# Patient Record
Sex: Female | Born: 1955
Health system: Southern US, Community
[De-identification: ages and names within clinical notes are randomized; demographics above are authoritative.]

## PROBLEM LIST (undated history)

## (undated) DIAGNOSIS — K219 Gastro-esophageal reflux disease without esophagitis: Secondary | ICD-10-CM

## (undated) DIAGNOSIS — C50919 Malignant neoplasm of unspecified site of unspecified female breast: Secondary | ICD-10-CM

## (undated) DIAGNOSIS — Z8719 Personal history of other diseases of the digestive system: Secondary | ICD-10-CM

## (undated) HISTORY — DX: Malignant neoplasm of unspecified site of unspecified female breast: C50.919

## (undated) HISTORY — PX: BREAST BIOPSY: SHX20

## (undated) HISTORY — PX: BREAST SURGERY: SHX581

---

## 1981-10-09 HISTORY — PX: TUBAL LIGATION: SHX77

## 2000-04-25 ENCOUNTER — Encounter: Payer: Self-pay | Admitting: Family Medicine

## 2000-04-25 ENCOUNTER — Encounter: Admission: RE | Admit: 2000-04-25 | Discharge: 2000-04-25 | Payer: Self-pay | Admitting: Family Medicine

## 2003-02-02 ENCOUNTER — Encounter: Admission: RE | Admit: 2003-02-02 | Discharge: 2003-02-02 | Payer: Self-pay | Admitting: Family Medicine

## 2003-02-02 ENCOUNTER — Encounter: Payer: Self-pay | Admitting: Family Medicine

## 2003-12-30 ENCOUNTER — Other Ambulatory Visit: Admission: RE | Admit: 2003-12-30 | Discharge: 2003-12-30 | Payer: Self-pay | Admitting: Family Medicine

## 2004-02-03 ENCOUNTER — Encounter: Admission: RE | Admit: 2004-02-03 | Discharge: 2004-02-03 | Payer: Self-pay | Admitting: Family Medicine

## 2004-10-09 HISTORY — PX: BREAST LUMPECTOMY: SHX2

## 2004-12-07 ENCOUNTER — Encounter (INDEPENDENT_AMBULATORY_CARE_PROVIDER_SITE_OTHER): Payer: Self-pay | Admitting: Specialist

## 2004-12-07 ENCOUNTER — Encounter: Admission: RE | Admit: 2004-12-07 | Discharge: 2004-12-07 | Payer: Self-pay | Admitting: Family Medicine

## 2004-12-07 ENCOUNTER — Encounter (INDEPENDENT_AMBULATORY_CARE_PROVIDER_SITE_OTHER): Payer: Self-pay | Admitting: Radiology

## 2004-12-09 ENCOUNTER — Ambulatory Visit: Payer: Self-pay | Admitting: Oncology

## 2004-12-14 ENCOUNTER — Encounter: Admission: RE | Admit: 2004-12-14 | Discharge: 2004-12-14 | Payer: Self-pay | Admitting: General Surgery

## 2004-12-15 ENCOUNTER — Ambulatory Visit: Admission: RE | Admit: 2004-12-15 | Discharge: 2004-12-15 | Payer: Self-pay | Admitting: Oncology

## 2004-12-16 ENCOUNTER — Ambulatory Visit: Payer: Self-pay | Admitting: Internal Medicine

## 2004-12-19 ENCOUNTER — Ambulatory Visit (HOSPITAL_BASED_OUTPATIENT_CLINIC_OR_DEPARTMENT_OTHER): Admission: RE | Admit: 2004-12-19 | Discharge: 2004-12-19 | Payer: Self-pay | Admitting: General Surgery

## 2004-12-19 ENCOUNTER — Ambulatory Visit (HOSPITAL_COMMUNITY): Admission: RE | Admit: 2004-12-19 | Discharge: 2004-12-19 | Payer: Self-pay | Admitting: General Surgery

## 2005-01-11 ENCOUNTER — Encounter: Admission: RE | Admit: 2005-01-11 | Discharge: 2005-01-11 | Payer: Self-pay | Admitting: General Surgery

## 2005-01-24 ENCOUNTER — Ambulatory Visit: Payer: Self-pay | Admitting: Oncology

## 2005-03-21 ENCOUNTER — Ambulatory Visit: Payer: Self-pay | Admitting: Oncology

## 2005-05-01 ENCOUNTER — Encounter: Admission: RE | Admit: 2005-05-01 | Discharge: 2005-05-01 | Payer: Self-pay | Admitting: General Surgery

## 2005-05-11 ENCOUNTER — Encounter (INDEPENDENT_AMBULATORY_CARE_PROVIDER_SITE_OTHER): Payer: Self-pay | Admitting: Specialist

## 2005-05-11 ENCOUNTER — Encounter: Admission: RE | Admit: 2005-05-11 | Discharge: 2005-05-11 | Payer: Self-pay | Admitting: General Surgery

## 2005-05-11 ENCOUNTER — Ambulatory Visit (HOSPITAL_COMMUNITY): Admission: RE | Admit: 2005-05-11 | Discharge: 2005-05-11 | Payer: Self-pay | Admitting: General Surgery

## 2005-05-11 ENCOUNTER — Ambulatory Visit (HOSPITAL_BASED_OUTPATIENT_CLINIC_OR_DEPARTMENT_OTHER): Admission: RE | Admit: 2005-05-11 | Discharge: 2005-05-11 | Payer: Self-pay | Admitting: General Surgery

## 2005-05-11 ENCOUNTER — Encounter (INDEPENDENT_AMBULATORY_CARE_PROVIDER_SITE_OTHER): Payer: Self-pay | Admitting: General Surgery

## 2005-05-31 ENCOUNTER — Ambulatory Visit: Payer: Self-pay | Admitting: Oncology

## 2005-06-02 ENCOUNTER — Ambulatory Visit: Admission: RE | Admit: 2005-06-02 | Discharge: 2005-08-09 | Payer: Self-pay | Admitting: Radiation Oncology

## 2005-08-18 ENCOUNTER — Ambulatory Visit: Payer: Self-pay | Admitting: Oncology

## 2005-08-24 ENCOUNTER — Ambulatory Visit (HOSPITAL_BASED_OUTPATIENT_CLINIC_OR_DEPARTMENT_OTHER): Admission: RE | Admit: 2005-08-24 | Discharge: 2005-08-24 | Payer: Self-pay | Admitting: General Surgery

## 2005-11-13 ENCOUNTER — Ambulatory Visit: Payer: Self-pay | Admitting: Oncology

## 2005-11-13 ENCOUNTER — Encounter: Admission: RE | Admit: 2005-11-13 | Discharge: 2005-11-13 | Payer: Self-pay | Admitting: Oncology

## 2005-12-18 ENCOUNTER — Other Ambulatory Visit: Admission: RE | Admit: 2005-12-18 | Discharge: 2005-12-18 | Payer: Self-pay | Admitting: Family Medicine

## 2006-03-06 ENCOUNTER — Ambulatory Visit: Payer: Self-pay | Admitting: Oncology

## 2006-03-12 LAB — CBC WITH DIFFERENTIAL/PLATELET
Basophils Absolute: 0 10*3/uL (ref 0.0–0.1)
Eosinophils Absolute: 0.1 10*3/uL (ref 0.0–0.5)
HCT: 46.4 % (ref 34.8–46.6)
HGB: 15.9 g/dL (ref 11.6–15.9)
LYMPH%: 34.5 % (ref 14.0–48.0)
MCV: 91 fL (ref 81.0–101.0)
MONO%: 9.9 % (ref 0.0–13.0)
NEUT#: 2 10*3/uL (ref 1.5–6.5)
NEUT%: 52.3 % (ref 39.6–76.8)
Platelets: 270 10*3/uL (ref 145–400)
RBC: 5.1 10*6/uL (ref 3.70–5.32)

## 2006-03-12 LAB — COMPREHENSIVE METABOLIC PANEL
Alkaline Phosphatase: 77 U/L (ref 39–117)
BUN: 11 mg/dL (ref 6–23)
Creatinine, Ser: 0.91 mg/dL (ref 0.40–1.20)
Glucose, Bld: 78 mg/dL (ref 70–99)
Total Bilirubin: 0.7 mg/dL (ref 0.3–1.2)

## 2006-03-12 LAB — LACTATE DEHYDROGENASE: LDH: 138 U/L (ref 94–250)

## 2006-07-05 ENCOUNTER — Ambulatory Visit: Payer: Self-pay | Admitting: Oncology

## 2006-10-09 HISTORY — PX: MASTECTOMY: SHX3

## 2006-10-09 HISTORY — PX: RECONSTRUCTION BREAST W/ TRAM FLAP: SUR1079

## 2006-11-14 ENCOUNTER — Encounter: Admission: RE | Admit: 2006-11-14 | Discharge: 2006-11-14 | Payer: Self-pay | Admitting: Oncology

## 2006-11-14 ENCOUNTER — Ambulatory Visit (HOSPITAL_COMMUNITY): Admission: RE | Admit: 2006-11-14 | Discharge: 2006-11-14 | Payer: Self-pay | Admitting: Oncology

## 2006-11-28 ENCOUNTER — Encounter (INDEPENDENT_AMBULATORY_CARE_PROVIDER_SITE_OTHER): Payer: Self-pay | Admitting: Diagnostic Radiology

## 2006-11-28 ENCOUNTER — Encounter: Admission: RE | Admit: 2006-11-28 | Discharge: 2006-11-28 | Payer: Self-pay | Admitting: Oncology

## 2006-11-28 ENCOUNTER — Encounter (INDEPENDENT_AMBULATORY_CARE_PROVIDER_SITE_OTHER): Payer: Self-pay | Admitting: Specialist

## 2006-11-30 ENCOUNTER — Ambulatory Visit: Payer: Self-pay | Admitting: Oncology

## 2006-12-03 LAB — CBC WITH DIFFERENTIAL/PLATELET
BASO%: 0.8 % (ref 0.0–2.0)
EOS%: 1.6 % (ref 0.0–7.0)
LYMPH%: 37.1 % (ref 14.0–48.0)
MCHC: 34.9 g/dL (ref 32.0–36.0)
MCV: 88.7 fL (ref 81.0–101.0)
MONO%: 8.6 % (ref 0.0–13.0)
NEUT#: 2.5 10*3/uL (ref 1.5–6.5)
Platelets: 292 10*3/uL (ref 145–400)
RBC: 5.07 10*6/uL (ref 3.70–5.32)
RDW: 12.9 % (ref 11.3–14.5)
WBC: 4.8 10*3/uL (ref 3.9–10.0)

## 2006-12-03 LAB — LACTATE DEHYDROGENASE: LDH: 144 U/L (ref 94–250)

## 2006-12-03 LAB — COMPREHENSIVE METABOLIC PANEL
BUN: 12 mg/dL (ref 6–23)
CO2: 27 mEq/L (ref 19–32)
Calcium: 10.3 mg/dL (ref 8.4–10.5)
Chloride: 105 mEq/L (ref 96–112)
Creatinine, Ser: 0.82 mg/dL (ref 0.40–1.20)
Glucose, Bld: 88 mg/dL (ref 70–99)

## 2006-12-12 ENCOUNTER — Ambulatory Visit (HOSPITAL_COMMUNITY): Admission: RE | Admit: 2006-12-12 | Discharge: 2006-12-12 | Payer: Self-pay | Admitting: Oncology

## 2006-12-17 LAB — CBC WITH DIFFERENTIAL/PLATELET
BASO%: 0.7 % (ref 0.0–2.0)
Eosinophils Absolute: 0.1 10*3/uL (ref 0.0–0.5)
HCT: 47.2 % — ABNORMAL HIGH (ref 34.8–46.6)
LYMPH%: 36 % (ref 14.0–48.0)
MCHC: 34.9 g/dL (ref 32.0–36.0)
MCV: 88.3 fL (ref 81.0–101.0)
MONO#: 0.4 10*3/uL (ref 0.1–0.9)
MONO%: 8.6 % (ref 0.0–13.0)
NEUT%: 53.1 % (ref 39.6–76.8)
Platelets: 300 10*3/uL (ref 145–400)
WBC: 4.9 10*3/uL (ref 3.9–10.0)

## 2006-12-17 LAB — COMPREHENSIVE METABOLIC PANEL
Alkaline Phosphatase: 67 U/L (ref 39–117)
CO2: 25 mEq/L (ref 19–32)
Creatinine, Ser: 0.92 mg/dL (ref 0.40–1.20)
Glucose, Bld: 94 mg/dL (ref 70–99)
Total Bilirubin: 0.9 mg/dL (ref 0.3–1.2)

## 2006-12-17 LAB — LACTATE DEHYDROGENASE: LDH: 157 U/L (ref 94–250)

## 2006-12-27 ENCOUNTER — Encounter (INDEPENDENT_AMBULATORY_CARE_PROVIDER_SITE_OTHER): Payer: Self-pay | Admitting: *Deleted

## 2006-12-27 ENCOUNTER — Observation Stay (HOSPITAL_COMMUNITY): Admission: RE | Admit: 2006-12-27 | Discharge: 2006-12-28 | Payer: Self-pay | Admitting: General Surgery

## 2007-01-09 LAB — CBC WITH DIFFERENTIAL/PLATELET
EOS%: 2.7 % (ref 0.0–7.0)
HGB: 14.3 g/dL (ref 11.6–15.9)
LYMPH%: 27.7 % (ref 14.0–48.0)
MCH: 31.2 pg (ref 26.0–34.0)
MCHC: 35 g/dL (ref 32.0–36.0)
MCV: 89.2 fL (ref 81.0–101.0)
MONO%: 6.6 % (ref 0.0–13.0)
NEUT#: 4.1 10*3/uL (ref 1.5–6.5)
Platelets: 353 10*3/uL (ref 145–400)
RBC: 4.59 10*6/uL (ref 3.70–5.32)
RDW: 12.6 % (ref 11.3–14.5)
WBC: 6.6 10*3/uL (ref 3.9–10.0)

## 2007-01-16 ENCOUNTER — Ambulatory Visit (HOSPITAL_BASED_OUTPATIENT_CLINIC_OR_DEPARTMENT_OTHER): Admission: RE | Admit: 2007-01-16 | Discharge: 2007-01-16 | Payer: Self-pay | Admitting: General Surgery

## 2007-01-21 ENCOUNTER — Ambulatory Visit: Payer: Self-pay | Admitting: Oncology

## 2007-01-31 LAB — CBC WITH DIFFERENTIAL/PLATELET
BASO%: 0.9 % (ref 0.0–2.0)
Eosinophils Absolute: 0.1 10*3/uL (ref 0.0–0.5)
LYMPH%: 40.2 % (ref 14.0–48.0)
MCHC: 35.1 g/dL (ref 32.0–36.0)
MONO#: 0.6 10*3/uL (ref 0.1–0.9)
MONO%: 11.4 % (ref 0.0–13.0)
NEUT#: 2.2 10*3/uL (ref 1.5–6.5)
RBC: 4.57 10*6/uL (ref 3.70–5.32)
RDW: 12.5 % (ref 11.3–14.5)
WBC: 4.9 10*3/uL (ref 3.9–10.0)

## 2007-02-07 LAB — CBC WITH DIFFERENTIAL/PLATELET
BASO%: 0.9 % (ref 0.0–2.0)
HCT: 39.3 % (ref 34.8–46.6)
MCHC: 34.9 g/dL (ref 32.0–36.0)
MONO#: 0.2 10*3/uL (ref 0.1–0.9)
NEUT%: 55.4 % (ref 39.6–76.8)
RBC: 4.4 10*6/uL (ref 3.70–5.32)
WBC: 3.9 10*3/uL (ref 3.9–10.0)
lymph#: 1.4 10*3/uL (ref 0.9–3.3)

## 2007-02-14 LAB — CBC WITH DIFFERENTIAL/PLATELET
Basophils Absolute: 0.1 10*3/uL (ref 0.0–0.1)
Eosinophils Absolute: 0.1 10*3/uL (ref 0.0–0.5)
HCT: 38 % (ref 34.8–46.6)
HGB: 13.7 g/dL (ref 11.6–15.9)
MONO#: 0.2 10*3/uL (ref 0.1–0.9)
NEUT%: 38.8 % — ABNORMAL LOW (ref 39.6–76.8)
WBC: 3 10*3/uL — ABNORMAL LOW (ref 3.9–10.0)
lymph#: 1.5 10*3/uL (ref 0.9–3.3)

## 2007-02-21 LAB — CBC WITH DIFFERENTIAL/PLATELET
Basophils Absolute: 0.1 10*3/uL (ref 0.0–0.1)
EOS%: 1.1 % (ref 0.0–7.0)
HCT: 37.5 % (ref 34.8–46.6)
HGB: 13.1 g/dL (ref 11.6–15.9)
MCH: 30.6 pg (ref 26.0–34.0)
MCV: 87.2 fL (ref 81.0–101.0)
MONO%: 8.3 % (ref 0.0–13.0)
NEUT%: 35.1 % — ABNORMAL LOW (ref 39.6–76.8)

## 2007-02-21 LAB — COMPREHENSIVE METABOLIC PANEL
AST: 42 U/L — ABNORMAL HIGH (ref 0–37)
BUN: 9 mg/dL (ref 6–23)
Calcium: 9.3 mg/dL (ref 8.4–10.5)
Chloride: 107 mEq/L (ref 96–112)
Creatinine, Ser: 0.77 mg/dL (ref 0.40–1.20)
Glucose, Bld: 105 mg/dL — ABNORMAL HIGH (ref 70–99)

## 2007-02-28 LAB — CBC WITH DIFFERENTIAL/PLATELET
Basophils Absolute: 0.1 10*3/uL (ref 0.0–0.1)
EOS%: 1.2 % (ref 0.0–7.0)
Eosinophils Absolute: 0 10*3/uL (ref 0.0–0.5)
HCT: 38.9 % (ref 34.8–46.6)
HGB: 13.3 g/dL (ref 11.6–15.9)
MCH: 30.2 pg (ref 26.0–34.0)
MONO#: 0.4 10*3/uL (ref 0.1–0.9)
NEUT#: 1.3 10*3/uL — ABNORMAL LOW (ref 1.5–6.5)
NEUT%: 39.2 % — ABNORMAL LOW (ref 39.6–76.8)
lymph#: 1.4 10*3/uL (ref 0.9–3.3)

## 2007-03-07 ENCOUNTER — Ambulatory Visit: Payer: Self-pay | Admitting: Oncology

## 2007-03-07 LAB — CBC WITH DIFFERENTIAL/PLATELET
BASO%: 1.1 % (ref 0.0–2.0)
EOS%: 1.7 % (ref 0.0–7.0)
Eosinophils Absolute: 0.1 10*3/uL (ref 0.0–0.5)
HCT: 37.4 % (ref 34.8–46.6)
LYMPH%: 36.4 % (ref 14.0–48.0)
MCHC: 35 g/dL (ref 32.0–36.0)
MCV: 87.7 fL (ref 81.0–101.0)
MONO#: 0.3 10*3/uL (ref 0.1–0.9)
MONO%: 8.3 % (ref 0.0–13.0)
NEUT%: 52.5 % (ref 39.6–76.8)
Platelets: 180 10*3/uL (ref 145–400)
WBC: 3.8 10*3/uL — ABNORMAL LOW (ref 3.9–10.0)

## 2007-03-14 LAB — CBC WITH DIFFERENTIAL/PLATELET
BASO%: 1.6 % (ref 0.0–2.0)
EOS%: 1.5 % (ref 0.0–7.0)
HCT: 32.8 % — ABNORMAL LOW (ref 34.8–46.6)
LYMPH%: 60.9 % — ABNORMAL HIGH (ref 14.0–48.0)
MCH: 30.3 pg (ref 26.0–34.0)
MCHC: 34.9 g/dL (ref 32.0–36.0)
NEUT%: 26.6 % — ABNORMAL LOW (ref 39.6–76.8)
Platelets: 248 10*3/uL (ref 145–400)
RBC: 3.78 10*6/uL (ref 3.70–5.32)

## 2007-03-21 LAB — CBC WITH DIFFERENTIAL/PLATELET
BASO%: 2 % (ref 0.0–2.0)
EOS%: 1.1 % (ref 0.0–7.0)
MCH: 30.5 pg (ref 26.0–34.0)
MCHC: 34.1 g/dL (ref 32.0–36.0)
NEUT%: 19.4 % — ABNORMAL LOW (ref 39.6–76.8)
RBC: 4.05 10*6/uL (ref 3.70–5.32)
RDW: 14.5 % (ref 11.3–14.5)
lymph#: 2 10*3/uL (ref 0.9–3.3)

## 2007-03-21 LAB — COMPREHENSIVE METABOLIC PANEL
ALT: 72 U/L — ABNORMAL HIGH (ref 0–35)
AST: 42 U/L — ABNORMAL HIGH (ref 0–37)
Calcium: 9 mg/dL (ref 8.4–10.5)
Chloride: 109 mEq/L (ref 96–112)
Creatinine, Ser: 0.83 mg/dL (ref 0.40–1.20)
Potassium: 4 mEq/L (ref 3.5–5.3)
Sodium: 145 mEq/L (ref 135–145)

## 2007-03-28 LAB — CBC WITH DIFFERENTIAL/PLATELET
BASO%: 1.5 % (ref 0.0–2.0)
EOS%: 1.4 % (ref 0.0–7.0)
HCT: 36.9 % (ref 34.8–46.6)
LYMPH%: 49.6 % — ABNORMAL HIGH (ref 14.0–48.0)
MCH: 30.3 pg (ref 26.0–34.0)
MCHC: 34.1 g/dL (ref 32.0–36.0)
NEUT%: 30.1 % — ABNORMAL LOW (ref 39.6–76.8)
lymph#: 1.7 10*3/uL (ref 0.9–3.3)

## 2007-03-28 LAB — COMPREHENSIVE METABOLIC PANEL
ALT: 60 U/L — ABNORMAL HIGH (ref 0–35)
AST: 44 U/L — ABNORMAL HIGH (ref 0–37)
Alkaline Phosphatase: 80 U/L (ref 39–117)
Chloride: 107 mEq/L (ref 96–112)
Creatinine, Ser: 0.84 mg/dL (ref 0.40–1.20)
Total Bilirubin: 0.6 mg/dL (ref 0.3–1.2)

## 2007-04-04 LAB — CBC WITH DIFFERENTIAL/PLATELET
BASO%: 1.6 % (ref 0.0–2.0)
Basophils Absolute: 0 10*3/uL (ref 0.0–0.1)
EOS%: 2.2 % (ref 0.0–7.0)
HCT: 36 % (ref 34.8–46.6)
HGB: 12.4 g/dL (ref 11.6–15.9)
LYMPH%: 43 % (ref 14.0–48.0)
MCH: 30 pg (ref 26.0–34.0)
MCHC: 34.6 g/dL (ref 32.0–36.0)
MCV: 86.9 fL (ref 81.0–101.0)
NEUT%: 45.8 % (ref 39.6–76.8)
Platelets: 234 10*3/uL (ref 145–400)

## 2007-04-04 LAB — COMPREHENSIVE METABOLIC PANEL
ALT: 71 U/L — ABNORMAL HIGH (ref 0–35)
AST: 50 U/L — ABNORMAL HIGH (ref 0–37)
BUN: 10 mg/dL (ref 6–23)
CO2: 24 mEq/L (ref 19–32)
Calcium: 9.7 mg/dL (ref 8.4–10.5)
Creatinine, Ser: 0.81 mg/dL (ref 0.40–1.20)
Total Bilirubin: 0.8 mg/dL (ref 0.3–1.2)

## 2007-04-10 LAB — COMPREHENSIVE METABOLIC PANEL
BUN: 10 mg/dL (ref 6–23)
CO2: 24 mEq/L (ref 19–32)
Calcium: 9.1 mg/dL (ref 8.4–10.5)
Chloride: 111 mEq/L (ref 96–112)
Creatinine, Ser: 0.77 mg/dL (ref 0.40–1.20)
Glucose, Bld: 122 mg/dL — ABNORMAL HIGH (ref 70–99)
Total Bilirubin: 0.5 mg/dL (ref 0.3–1.2)

## 2007-04-10 LAB — CBC WITH DIFFERENTIAL/PLATELET
BASO%: 1.4 % (ref 0.0–2.0)
Basophils Absolute: 0 10*3/uL (ref 0.0–0.1)
HCT: 34.7 % — ABNORMAL LOW (ref 34.8–46.6)
HGB: 12.2 g/dL (ref 11.6–15.9)
LYMPH%: 58.5 % — ABNORMAL HIGH (ref 14.0–48.0)
MCHC: 35.2 g/dL (ref 32.0–36.0)
MONO#: 0.2 10*3/uL (ref 0.1–0.9)
NEUT%: 30.3 % — ABNORMAL LOW (ref 39.6–76.8)
Platelets: 265 10*3/uL (ref 145–400)
WBC: 3.1 10*3/uL — ABNORMAL LOW (ref 3.9–10.0)
lymph#: 1.8 10*3/uL (ref 0.9–3.3)

## 2007-04-17 LAB — CBC WITH DIFFERENTIAL/PLATELET
BASO%: 2.1 % — ABNORMAL HIGH (ref 0.0–2.0)
Basophils Absolute: 0.1 10*3/uL (ref 0.0–0.1)
Eosinophils Absolute: 0.1 10*3/uL (ref 0.0–0.5)
HCT: 36.4 % (ref 34.8–46.6)
HGB: 12.3 g/dL (ref 11.6–15.9)
LYMPH%: 49.7 % — ABNORMAL HIGH (ref 14.0–48.0)
MCHC: 33.9 g/dL (ref 32.0–36.0)
MONO#: 0.7 10*3/uL (ref 0.1–0.9)
NEUT#: 1 10*3/uL — ABNORMAL LOW (ref 1.5–6.5)
NEUT%: 27.8 % — ABNORMAL LOW (ref 39.6–76.8)
Platelets: 354 10*3/uL (ref 145–400)
WBC: 3.7 10*3/uL — ABNORMAL LOW (ref 3.9–10.0)
lymph#: 1.8 10*3/uL (ref 0.9–3.3)

## 2007-04-17 LAB — COMPREHENSIVE METABOLIC PANEL
AST: 37 U/L (ref 0–37)
Alkaline Phosphatase: 65 U/L (ref 39–117)
BUN: 11 mg/dL (ref 6–23)
Calcium: 9.3 mg/dL (ref 8.4–10.5)
Chloride: 108 mEq/L (ref 96–112)
Creatinine, Ser: 0.88 mg/dL (ref 0.40–1.20)
Glucose, Bld: 111 mg/dL — ABNORMAL HIGH (ref 70–99)

## 2007-04-19 ENCOUNTER — Ambulatory Visit: Payer: Self-pay | Admitting: Oncology

## 2007-04-23 ENCOUNTER — Ambulatory Visit: Admission: RE | Admit: 2007-04-23 | Discharge: 2007-04-23 | Payer: Self-pay | Admitting: Oncology

## 2007-04-24 LAB — CBC WITH DIFFERENTIAL/PLATELET
BASO%: 1.9 % (ref 0.0–2.0)
EOS%: 2.5 % (ref 0.0–7.0)
HCT: 36.9 % (ref 34.8–46.6)
MCH: 30.5 pg (ref 26.0–34.0)
MCHC: 34.1 g/dL (ref 32.0–36.0)
MONO#: 0.8 10*3/uL (ref 0.1–0.9)
NEUT%: 42.3 % (ref 39.6–76.8)
RBC: 4.12 10*6/uL (ref 3.70–5.32)
RDW: 13.9 % (ref 11.3–14.5)
WBC: 5.7 10*3/uL (ref 3.9–10.0)
lymph#: 2.2 10*3/uL (ref 0.9–3.3)

## 2007-04-24 LAB — COMPREHENSIVE METABOLIC PANEL
ALT: 45 U/L — ABNORMAL HIGH (ref 0–35)
Albumin: 4.2 g/dL (ref 3.5–5.2)
CO2: 24 mEq/L (ref 19–32)
Calcium: 9.7 mg/dL (ref 8.4–10.5)
Chloride: 107 mEq/L (ref 96–112)
Creatinine, Ser: 0.91 mg/dL (ref 0.40–1.20)
Potassium: 4.1 mEq/L (ref 3.5–5.3)

## 2007-05-01 LAB — CBC WITH DIFFERENTIAL/PLATELET
Basophils Absolute: 0.1 10*3/uL (ref 0.0–0.1)
Eosinophils Absolute: 0.1 10*3/uL (ref 0.0–0.5)
HCT: 35.7 % (ref 34.8–46.6)
HGB: 12.1 g/dL (ref 11.6–15.9)
MONO#: 0.6 10*3/uL (ref 0.1–0.9)
NEUT#: 1.7 10*3/uL (ref 1.5–6.5)
NEUT%: 39.4 % — ABNORMAL LOW (ref 39.6–76.8)
RDW: 14.3 % (ref 11.3–14.5)
WBC: 4.3 10*3/uL (ref 3.9–10.0)
lymph#: 1.9 10*3/uL (ref 0.9–3.3)

## 2007-05-08 LAB — CBC WITH DIFFERENTIAL/PLATELET
Basophils Absolute: 0.1 10*3/uL (ref 0.0–0.1)
EOS%: 1 % (ref 0.0–7.0)
Eosinophils Absolute: 0 10*3/uL (ref 0.0–0.5)
HCT: 34 % — ABNORMAL LOW (ref 34.8–46.6)
HGB: 11.7 g/dL (ref 11.6–15.9)
MCH: 30.5 pg (ref 26.0–34.0)
MCV: 88.5 fL (ref 81.0–101.0)
MONO%: 15.3 % — ABNORMAL HIGH (ref 0.0–13.0)
NEUT#: 1.6 10*3/uL (ref 1.5–6.5)
NEUT%: 38.7 % — ABNORMAL LOW (ref 39.6–76.8)
RDW: 14.8 % — ABNORMAL HIGH (ref 11.3–14.5)
lymph#: 1.8 10*3/uL (ref 0.9–3.3)

## 2007-05-15 LAB — CBC WITH DIFFERENTIAL/PLATELET
Basophils Absolute: 0.1 10*3/uL (ref 0.0–0.1)
EOS%: 1 % (ref 0.0–7.0)
Eosinophils Absolute: 0 10*3/uL (ref 0.0–0.5)
HGB: 12 g/dL (ref 11.6–15.9)
LYMPH%: 44.7 % (ref 14.0–48.0)
MCH: 30.2 pg (ref 26.0–34.0)
MCV: 89 fL (ref 81.0–101.0)
MONO%: 14.7 % — ABNORMAL HIGH (ref 0.0–13.0)
NEUT#: 1.4 10*3/uL — ABNORMAL LOW (ref 1.5–6.5)
Platelets: 205 10*3/uL (ref 145–400)
RDW: 15.4 % — ABNORMAL HIGH (ref 11.3–14.5)

## 2007-06-02 ENCOUNTER — Ambulatory Visit: Payer: Self-pay | Admitting: Oncology

## 2007-06-05 LAB — CBC WITH DIFFERENTIAL/PLATELET
BASO%: 1.4 % (ref 0.0–2.0)
Basophils Absolute: 0.1 10*3/uL (ref 0.0–0.1)
EOS%: 2.6 % (ref 0.0–7.0)
HGB: 12.9 g/dL (ref 11.6–15.9)
MCH: 29.4 pg (ref 26.0–34.0)
MCHC: 33.2 g/dL (ref 32.0–36.0)
MCV: 88.5 fL (ref 81.0–101.0)
MONO%: 15.2 % — ABNORMAL HIGH (ref 0.0–13.0)
RBC: 4.38 10*6/uL (ref 3.70–5.32)
RDW: 13.9 % (ref 11.3–14.5)
lymph#: 2.1 10*3/uL (ref 0.9–3.3)

## 2007-06-05 LAB — COMPREHENSIVE METABOLIC PANEL
ALT: 31 U/L (ref 0–35)
AST: 28 U/L (ref 0–37)
Albumin: 4.2 g/dL (ref 3.5–5.2)
Alkaline Phosphatase: 79 U/L (ref 39–117)
BUN: 13 mg/dL (ref 6–23)
Calcium: 9.1 mg/dL (ref 8.4–10.5)
Chloride: 108 mEq/L (ref 96–112)
Potassium: 3.7 mEq/L (ref 3.5–5.3)
Sodium: 143 mEq/L (ref 135–145)

## 2007-06-26 LAB — CBC WITH DIFFERENTIAL/PLATELET
Basophils Absolute: 0.1 10*3/uL (ref 0.0–0.1)
EOS%: 3.4 % (ref 0.0–7.0)
Eosinophils Absolute: 0.1 10*3/uL (ref 0.0–0.5)
LYMPH%: 39.2 % (ref 14.0–48.0)
MCH: 28.8 pg (ref 26.0–34.0)
MCV: 86.1 fL (ref 81.0–101.0)
MONO%: 14.5 % — ABNORMAL HIGH (ref 0.0–13.0)
NEUT#: 1.5 10*3/uL (ref 1.5–6.5)
Platelets: 253 10*3/uL (ref 145–400)
RBC: 4.42 10*6/uL (ref 3.70–5.32)

## 2007-07-17 ENCOUNTER — Ambulatory Visit: Payer: Self-pay | Admitting: Oncology

## 2007-07-17 LAB — COMPREHENSIVE METABOLIC PANEL
CO2: 24 mEq/L (ref 19–32)
Calcium: 9.8 mg/dL (ref 8.4–10.5)
Chloride: 107 mEq/L (ref 96–112)
Creatinine, Ser: 0.87 mg/dL (ref 0.40–1.20)
Glucose, Bld: 102 mg/dL — ABNORMAL HIGH (ref 70–99)
Total Bilirubin: 0.7 mg/dL (ref 0.3–1.2)

## 2007-07-17 LAB — CBC WITH DIFFERENTIAL/PLATELET
BASO%: 1.4 % (ref 0.0–2.0)
LYMPH%: 47 % (ref 14.0–48.0)
MCHC: 33.2 g/dL (ref 32.0–36.0)
MCV: 86.5 fL (ref 81.0–101.0)
MONO%: 13.9 % — ABNORMAL HIGH (ref 0.0–13.0)
Platelets: 265 10*3/uL (ref 145–400)
RBC: 4.61 10*6/uL (ref 3.70–5.32)
RDW: 12.9 % (ref 11.3–14.5)
WBC: 4 10*3/uL (ref 3.9–10.0)

## 2007-07-17 LAB — LACTATE DEHYDROGENASE: LDH: 213 U/L (ref 94–250)

## 2007-08-07 LAB — CBC WITH DIFFERENTIAL/PLATELET
BASO%: 1.6 % (ref 0.0–2.0)
Basophils Absolute: 0.1 10*3/uL (ref 0.0–0.1)
EOS%: 4 % (ref 0.0–7.0)
HCT: 40.6 % (ref 34.8–46.6)
HGB: 13.6 g/dL (ref 11.6–15.9)
MCH: 28.4 pg (ref 26.0–34.0)
MCHC: 33.5 g/dL (ref 32.0–36.0)
MONO#: 0.5 10*3/uL (ref 0.1–0.9)
NEUT%: 36.1 % — ABNORMAL LOW (ref 39.6–76.8)
RDW: 12.5 % (ref 11.3–14.5)
WBC: 3.9 10*3/uL (ref 3.9–10.0)
lymph#: 1.8 10*3/uL (ref 0.9–3.3)

## 2007-08-28 LAB — CBC WITH DIFFERENTIAL/PLATELET
Basophils Absolute: 0 10*3/uL (ref 0.0–0.1)
Eosinophils Absolute: 0.1 10*3/uL (ref 0.0–0.5)
HCT: 38.3 % (ref 34.8–46.6)
HGB: 12.8 g/dL (ref 11.6–15.9)
MCH: 28 pg (ref 26.0–34.0)
MONO#: 0.4 10*3/uL (ref 0.1–0.9)
NEUT#: 1.2 10*3/uL — ABNORMAL LOW (ref 1.5–6.5)
NEUT%: 35.1 % — ABNORMAL LOW (ref 39.6–76.8)
RDW: 12.5 % (ref 11.3–14.5)
lymph#: 1.7 10*3/uL (ref 0.9–3.3)

## 2007-08-28 LAB — COMPREHENSIVE METABOLIC PANEL
AST: 28 U/L (ref 0–37)
Albumin: 4.1 g/dL (ref 3.5–5.2)
BUN: 12 mg/dL (ref 6–23)
CO2: 25 mEq/L (ref 19–32)
Calcium: 9 mg/dL (ref 8.4–10.5)
Chloride: 106 mEq/L (ref 96–112)
Creatinine, Ser: 0.93 mg/dL (ref 0.40–1.20)
Glucose, Bld: 83 mg/dL (ref 70–99)
Potassium: 4 mEq/L (ref 3.5–5.3)

## 2007-08-28 LAB — LACTATE DEHYDROGENASE: LDH: 185 U/L (ref 94–250)

## 2007-08-28 IMAGING — CT CT CHEST W/ CM
2 of 5 series · 15 of 36 positions shown, 18 images · IV contrast (omnipaque)
Comparison: 12/15/04.
COMPARISON: 12/15/04.
COMPARISON: 12/15/04.

CLINICAL DATA: Restaging breast ca.  Right lumpectomy.  Chemotherapy and XRT completed.  
CHEST CT WITH CONTRAST:
TECHNIQUE: Multidetector CT imaging of the chest was performed following the standard protocol during bolus administration of intravenous contrast.
Contrast:  125 cc Omnipaque 300
TECHNIQUE: Multidetector CT imaging of the abdomen was performed following the standard protocol during bolus administration of intravenous contrast.
TECHNIQUE: Multidetector CT imaging of the pelvis was performed following the standard protocol during bolus administration of intravenous contrast.

[Series 2: cap 5.0 b40f st · axial · 0.69mm/px · z∈[-594,-48]mm · 12 of 125 slices shown, 15 images]
[im 8/125  mediastinal]
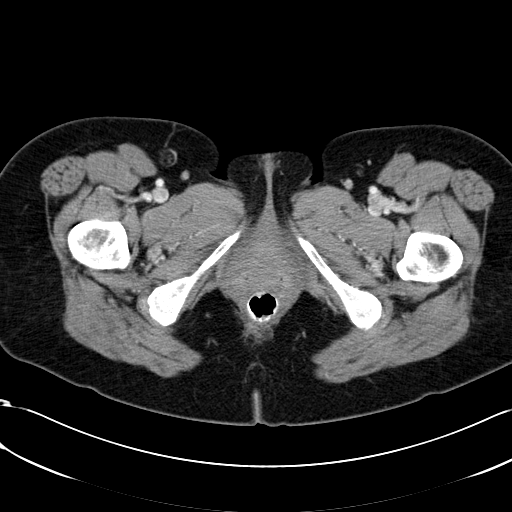
[im 8/125  lung]
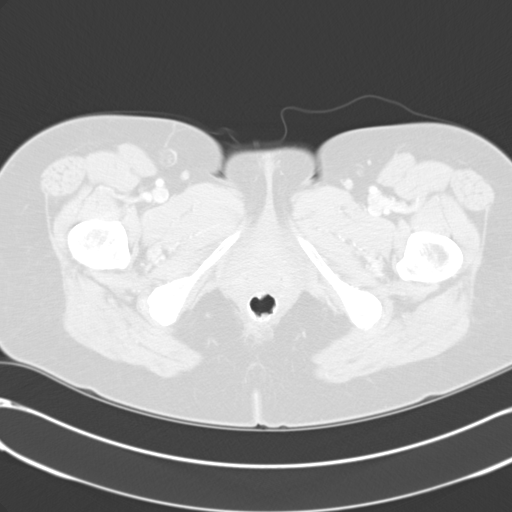
[im 16/125  lung]
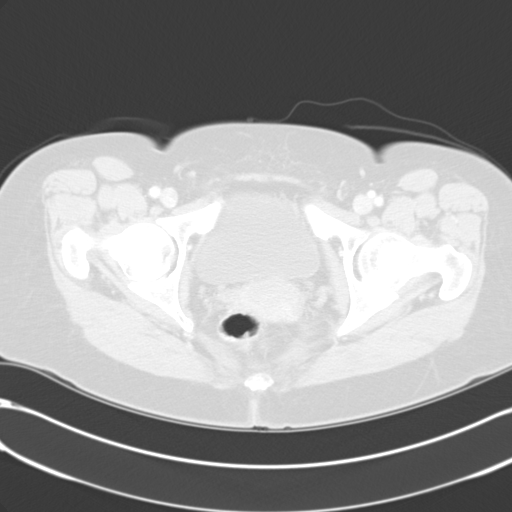
[im 32/125  lung]
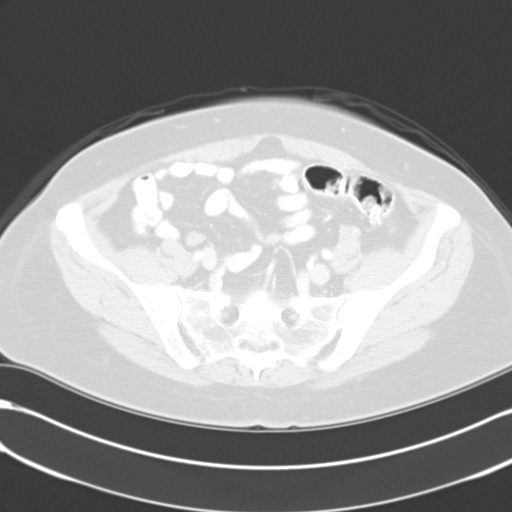
[im 39/125  lung]
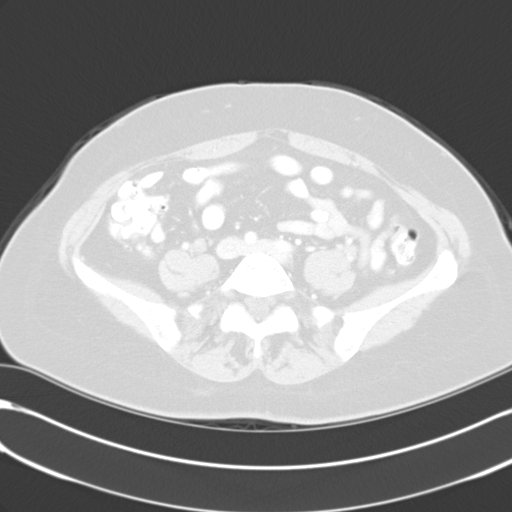
[im 47/125  mediastinal]
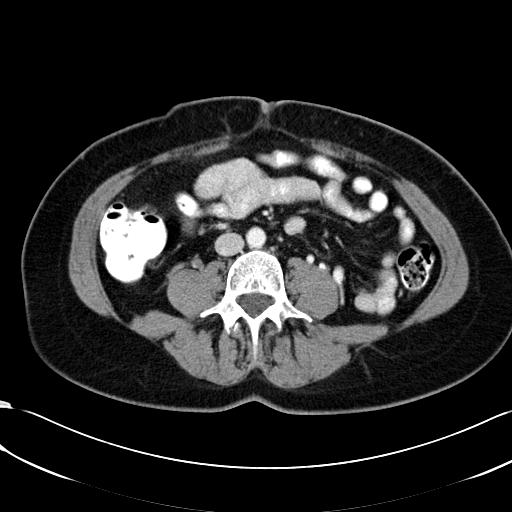
[im 47/125  lung]
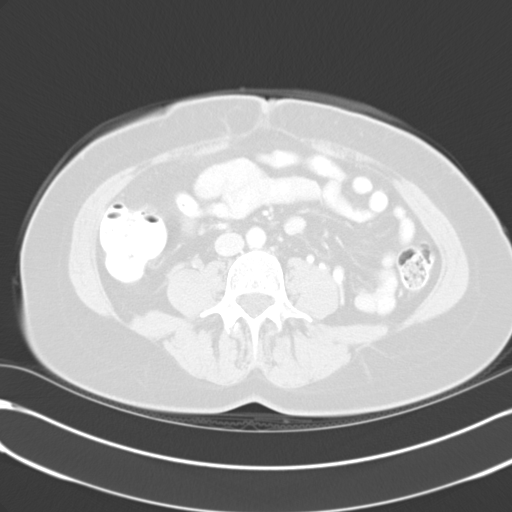
[im 55/125  lung]
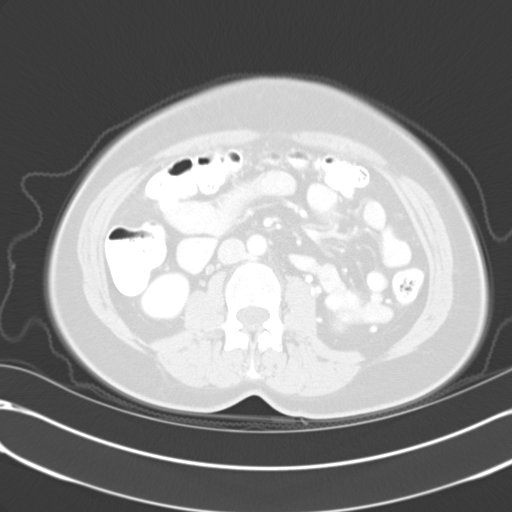
[im 70/125  lung]
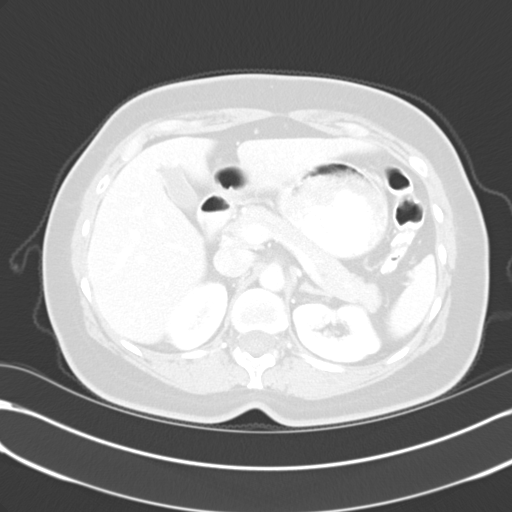
[im 78/125  lung]
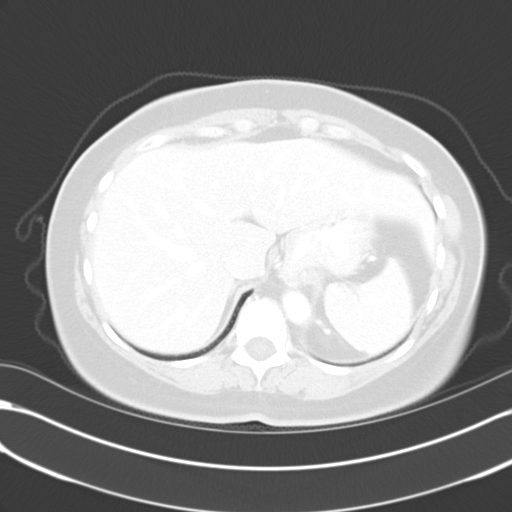
[im 86/125  mediastinal]
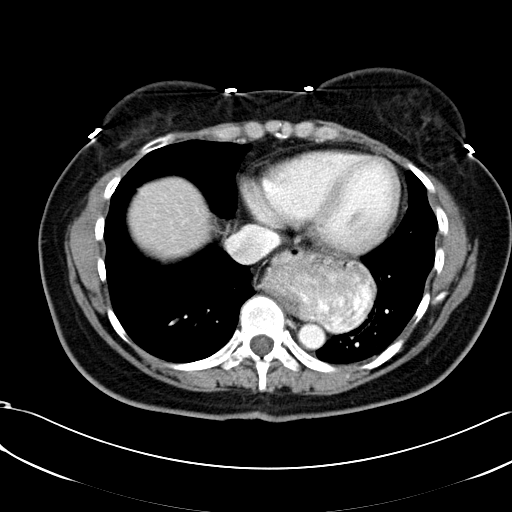
[im 86/125  lung]
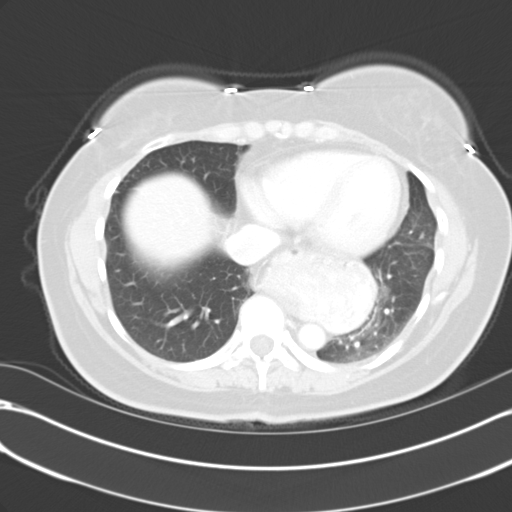
[im 94/125  lung]
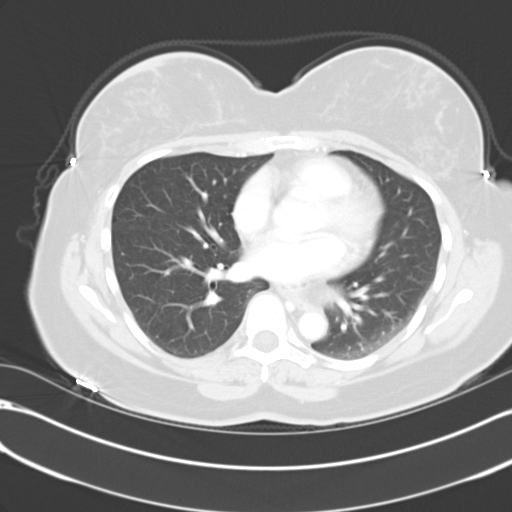
[im 109/125  lung]
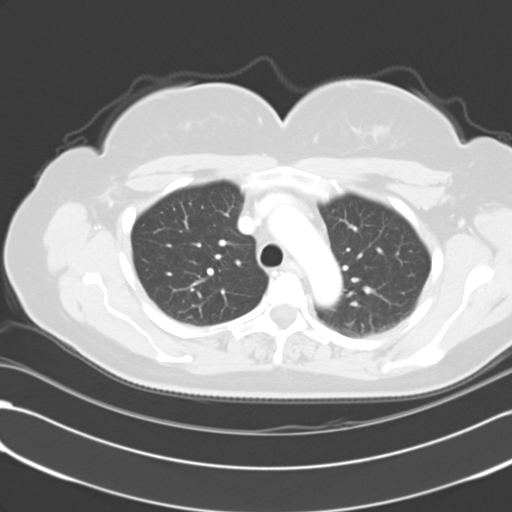
[im 117/125  lung]
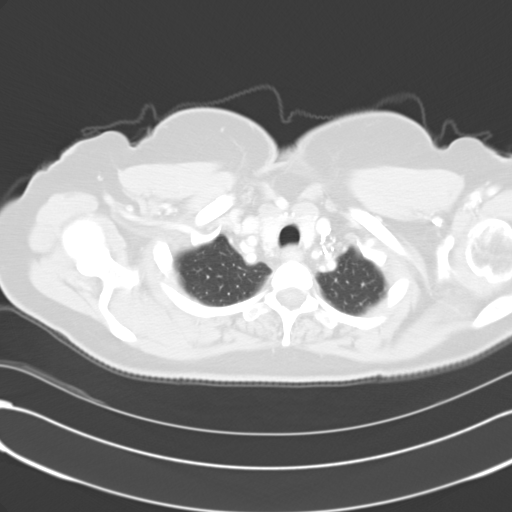

[Series 602: coronal · coronal · 1.25mm/px · 3 of 79 slices shown]
[im 16/79  lung]
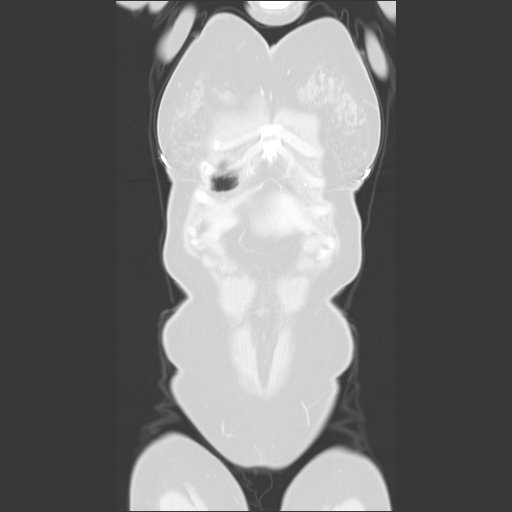
[im 32/79  lung]
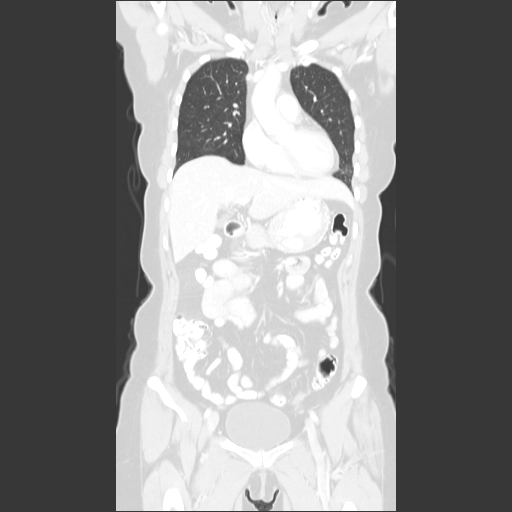
[im 47/79  lung]
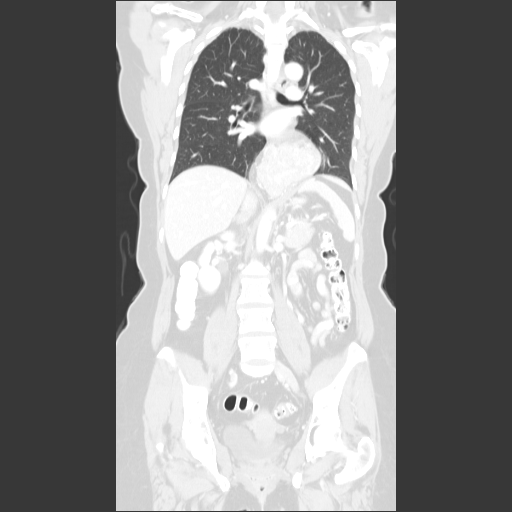

[15 of 36 positions shown; findings below may reference images not displayed]

FINDINGS: Large hiatal hernia.  No pathologically enlarged mediastinal or hilar lymph nodes.  No pulmonary nodules or masses.  Negative for metastatic involvement of the pleura or bones.  Findings compatible with post surgical scarring of the right breast.
IMPRESSION: Negative for metastatic involvement of the chest. 
ABDOMEN CT WITH CONTRAST:
FINDINGS: Large hiatal hernia.  Negative for metastatic involvement of the liver, spleen, or adrenal glands.  Pancreas and kidneys unremarkable.  No pathologically enlarged lymph nodes.  Bony structures intact.
IMPRESSION: No evidence for metastatic involvement of the abdomen. 
PELVIS CT WITH CONTRAST:
FINDINGS: Uterus and adnexal regions unremarkable.  No pelvic mass or pathologically enlarged lymph nodes.  Bony pelvis intact.
IMPRESSION: No evidence for metastatic involvement of the pelvis.   No interval change.

## 2007-09-23 ENCOUNTER — Ambulatory Visit: Payer: Self-pay | Admitting: Oncology

## 2007-09-25 LAB — COMPREHENSIVE METABOLIC PANEL
Alkaline Phosphatase: 65 U/L (ref 39–117)
CO2: 25 mEq/L (ref 19–32)
Creatinine, Ser: 0.99 mg/dL (ref 0.40–1.20)
Glucose, Bld: 95 mg/dL (ref 70–99)
Total Bilirubin: 0.6 mg/dL (ref 0.3–1.2)

## 2007-09-25 LAB — CBC WITH DIFFERENTIAL/PLATELET
BASO%: 1.2 % (ref 0.0–2.0)
Eosinophils Absolute: 0.2 10*3/uL (ref 0.0–0.5)
HCT: 40.9 % (ref 34.8–46.6)
LYMPH%: 31.7 % (ref 14.0–48.0)
MCHC: 32.6 g/dL (ref 32.0–36.0)
MCV: 83 fL (ref 81.0–101.0)
MONO#: 0.6 10*3/uL (ref 0.1–0.9)
MONO%: 10.6 % (ref 0.0–13.0)
NEUT%: 53.7 % (ref 39.6–76.8)
Platelets: 304 10*3/uL (ref 145–400)
WBC: 5.4 10*3/uL (ref 3.9–10.0)

## 2007-09-25 LAB — LACTATE DEHYDROGENASE: LDH: 197 U/L (ref 94–250)

## 2007-09-25 LAB — CANCER ANTIGEN 27.29: CA 27.29: 38 U/mL (ref 0–39)

## 2007-10-02 IMAGING — CR DG CHEST 1V PORT
1 series · 1 of 1 positions shown · non-contrast
Comparison: 12/24/06 and chest CT 12/12/06

CLINICAL DATA: Breast cancer, Port-A-Cath placement.  
 PORTABLE CHEST- 1 VIEW:

[view not recorded]
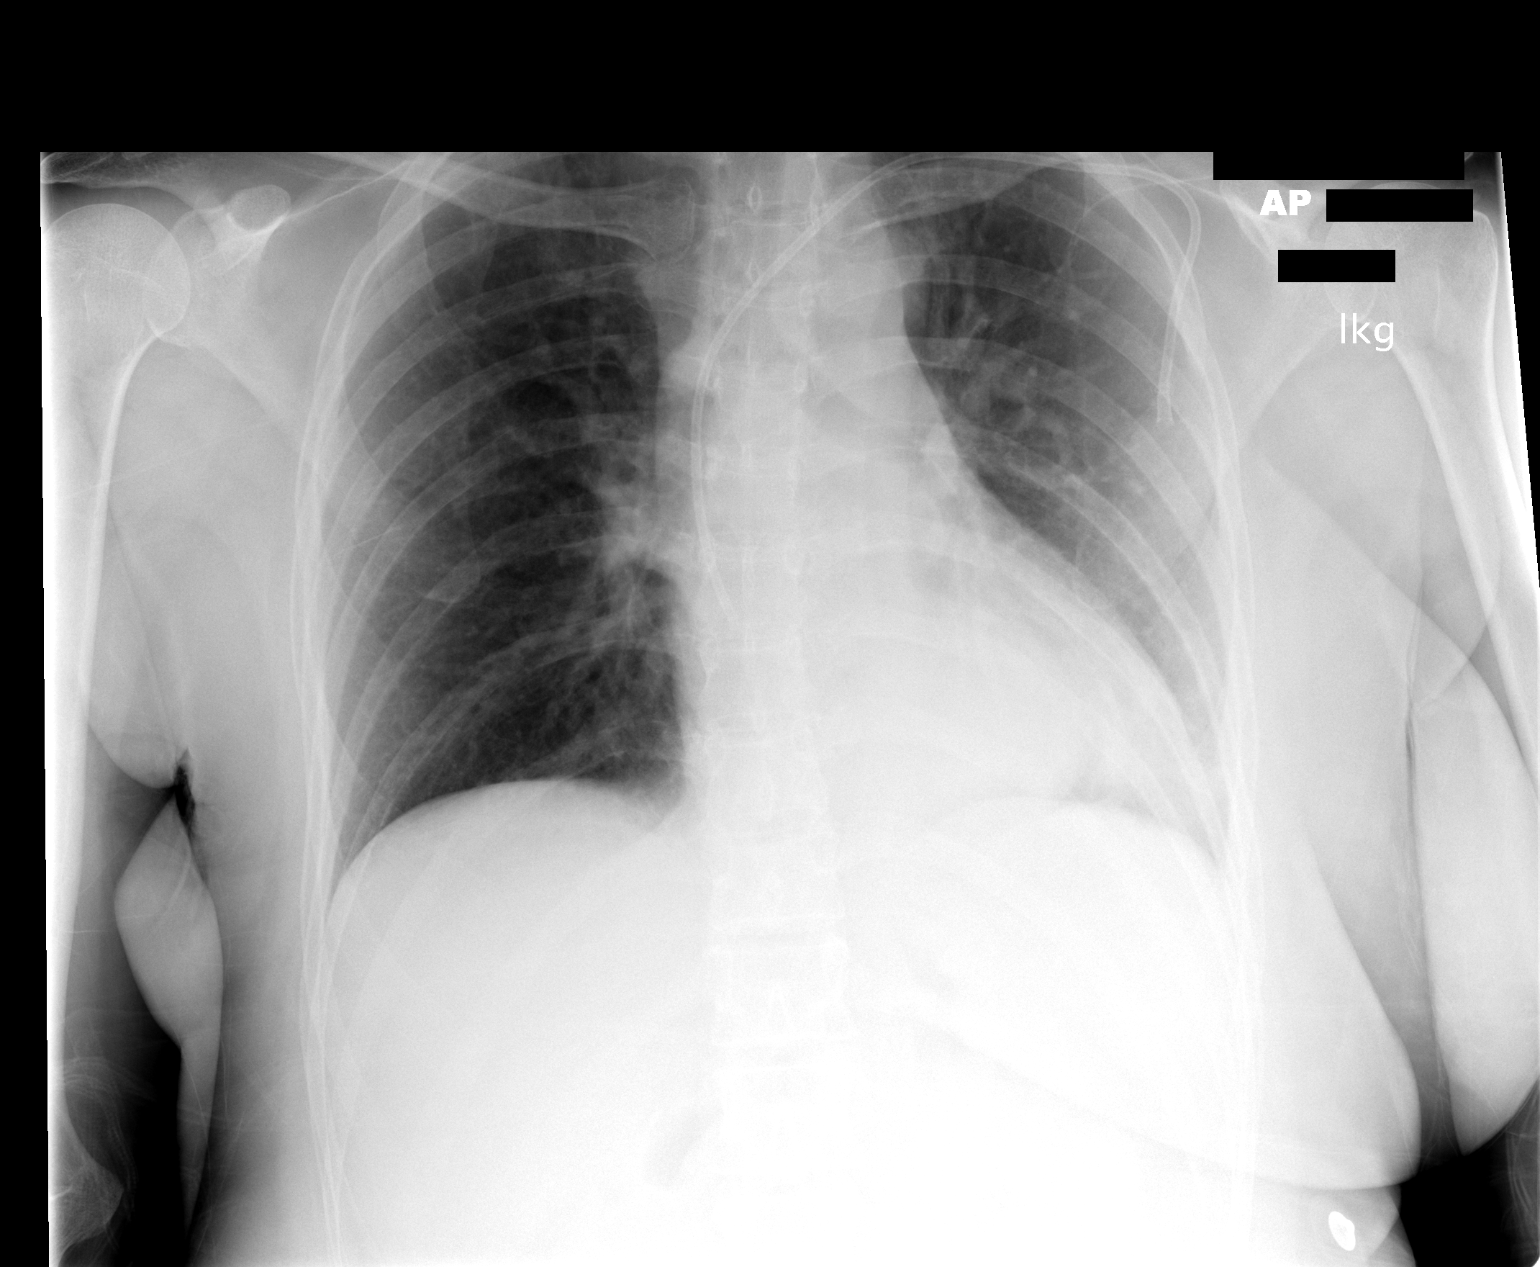

[1 of 1 positions shown; findings below may reference images not displayed]

FINDINGS: Left subclavian Port-A-Cath terminates in the distal SVC.  No pneumothorax.  Hiatal hernia with left base atelectasis.
IMPRESSION: No pneumothorax after left subclavian Port-A-Cath placement.

## 2007-10-10 HISTORY — PX: BREAST REDUCTION SURGERY: SHX8

## 2007-10-16 LAB — CBC WITH DIFFERENTIAL/PLATELET
BASO%: 1.6 % (ref 0.0–2.0)
HCT: 39.3 % (ref 34.8–46.6)
MCHC: 32.5 g/dL (ref 32.0–36.0)
MONO#: 0.5 10*3/uL (ref 0.1–0.9)
NEUT%: 36 % — ABNORMAL LOW (ref 39.6–76.8)
RBC: 4.74 10*6/uL (ref 3.70–5.32)
WBC: 4.2 10*3/uL (ref 3.9–10.0)
lymph#: 1.8 10*3/uL (ref 0.9–3.3)

## 2007-10-16 LAB — COMPREHENSIVE METABOLIC PANEL
ALT: 21 U/L (ref 0–35)
CO2: 24 mEq/L (ref 19–32)
Calcium: 9.6 mg/dL (ref 8.4–10.5)
Chloride: 108 mEq/L (ref 96–112)
Sodium: 143 mEq/L (ref 135–145)
Total Protein: 6.9 g/dL (ref 6.0–8.3)

## 2007-10-16 LAB — LACTATE DEHYDROGENASE: LDH: 169 U/L (ref 94–250)

## 2007-10-22 ENCOUNTER — Inpatient Hospital Stay (HOSPITAL_COMMUNITY): Admission: RE | Admit: 2007-10-22 | Discharge: 2007-10-24 | Payer: Self-pay | Admitting: Plastic Surgery

## 2007-11-04 ENCOUNTER — Ambulatory Visit: Payer: Self-pay | Admitting: Oncology

## 2007-11-06 LAB — CBC WITH DIFFERENTIAL/PLATELET
EOS%: 3 % (ref 0.0–7.0)
MCH: 26.5 pg (ref 26.0–34.0)
MCV: 79.8 fL — ABNORMAL LOW (ref 81.0–101.0)
MONO%: 12.6 % (ref 0.0–13.0)
RBC: 4.65 10*6/uL (ref 3.70–5.32)
RDW: 11.8 % (ref 11.3–14.5)

## 2007-11-18 ENCOUNTER — Encounter: Admission: RE | Admit: 2007-11-18 | Discharge: 2007-11-18 | Payer: Self-pay | Admitting: Family Medicine

## 2007-11-25 ENCOUNTER — Other Ambulatory Visit: Admission: RE | Admit: 2007-11-25 | Discharge: 2007-11-25 | Payer: Self-pay | Admitting: Family Medicine

## 2007-11-27 LAB — CBC WITH DIFFERENTIAL/PLATELET
BASO%: 1.8 % (ref 0.0–2.0)
EOS%: 2.7 % (ref 0.0–7.0)
HCT: 38.9 % (ref 34.8–46.6)
MCH: 26.2 pg (ref 26.0–34.0)
MCHC: 32.5 g/dL (ref 32.0–36.0)
NEUT%: 38.1 % — ABNORMAL LOW (ref 39.6–76.8)
RDW: 15.4 % — ABNORMAL HIGH (ref 11.3–14.5)
lymph#: 1.7 10*3/uL (ref 0.9–3.3)

## 2007-11-27 LAB — COMPREHENSIVE METABOLIC PANEL
ALT: 13 U/L (ref 0–35)
AST: 13 U/L (ref 0–37)
Calcium: 9.2 mg/dL (ref 8.4–10.5)
Chloride: 106 mEq/L (ref 96–112)
Creatinine, Ser: 0.91 mg/dL (ref 0.40–1.20)

## 2007-12-16 ENCOUNTER — Ambulatory Visit: Payer: Self-pay | Admitting: Oncology

## 2007-12-18 LAB — COMPREHENSIVE METABOLIC PANEL
ALT: 19 U/L (ref 0–35)
AST: 23 U/L (ref 0–37)
Calcium: 9.5 mg/dL (ref 8.4–10.5)
Chloride: 105 mEq/L (ref 96–112)
Creatinine, Ser: 0.78 mg/dL (ref 0.40–1.20)
Total Bilirubin: 0.7 mg/dL (ref 0.3–1.2)

## 2007-12-18 LAB — CBC WITH DIFFERENTIAL/PLATELET
BASO%: 1.1 % (ref 0.0–2.0)
EOS%: 4.7 % (ref 0.0–7.0)
HCT: 39.7 % (ref 34.8–46.6)
LYMPH%: 44.9 % (ref 14.0–48.0)
MCH: 26 pg (ref 26.0–34.0)
MCHC: 32.2 g/dL (ref 32.0–36.0)
MONO%: 11.2 % (ref 0.0–13.0)
NEUT%: 38.2 % — ABNORMAL LOW (ref 39.6–76.8)
Platelets: 264 10*3/uL (ref 145–400)

## 2008-01-08 LAB — CBC WITH DIFFERENTIAL/PLATELET
Basophils Absolute: 0.1 10*3/uL (ref 0.0–0.1)
Eosinophils Absolute: 0.2 10*3/uL (ref 0.0–0.5)
HCT: 37.8 % (ref 34.8–46.6)
HGB: 12.2 g/dL (ref 11.6–15.9)
MCV: 79.7 fL — ABNORMAL LOW (ref 81.0–101.0)
NEUT#: 1.8 10*3/uL (ref 1.5–6.5)
RDW: 14.9 % — ABNORMAL HIGH (ref 11.3–14.5)
lymph#: 2 10*3/uL (ref 0.9–3.3)

## 2008-01-27 ENCOUNTER — Ambulatory Visit: Payer: Self-pay | Admitting: Oncology

## 2008-01-29 LAB — CBC WITH DIFFERENTIAL/PLATELET
Basophils Absolute: 0.1 10*3/uL (ref 0.0–0.1)
Eosinophils Absolute: 0.2 10*3/uL (ref 0.0–0.5)
HGB: 12.5 g/dL (ref 11.6–15.9)
LYMPH%: 43.8 % (ref 14.0–48.0)
MCV: 78.1 fL — ABNORMAL LOW (ref 81.0–101.0)
MONO%: 12.7 % (ref 0.0–13.0)
NEUT#: 1.8 10*3/uL (ref 1.5–6.5)
NEUT%: 38.4 % — ABNORMAL LOW (ref 39.6–76.8)
Platelets: ADEQUATE 10*3/uL (ref 145–400)

## 2008-01-29 LAB — COMPREHENSIVE METABOLIC PANEL
BUN: 13 mg/dL (ref 6–23)
CO2: 24 mEq/L (ref 19–32)
Glucose, Bld: 116 mg/dL — ABNORMAL HIGH (ref 70–99)
Sodium: 140 mEq/L (ref 135–145)
Total Bilirubin: 0.7 mg/dL (ref 0.3–1.2)
Total Protein: 7.2 g/dL (ref 6.0–8.3)

## 2008-01-29 LAB — LACTATE DEHYDROGENASE: LDH: 160 U/L (ref 94–250)

## 2008-01-29 LAB — CANCER ANTIGEN 27.29: CA 27.29: 44 U/mL — ABNORMAL HIGH (ref 0–39)

## 2008-02-27 LAB — CBC WITH DIFFERENTIAL/PLATELET
BASO%: 0.5 % (ref 0.0–2.0)
HCT: 35.2 % (ref 34.8–46.6)
LYMPH%: 18.6 % (ref 14.0–48.0)
MCH: 24.6 pg — ABNORMAL LOW (ref 26.0–34.0)
MCHC: 32.2 g/dL (ref 32.0–36.0)
MCV: 76.7 fL — ABNORMAL LOW (ref 81.0–101.0)
MONO#: 0.7 10*3/uL (ref 0.1–0.9)
MONO%: 8.2 % (ref 0.0–13.0)
NEUT%: 71.3 % (ref 39.6–76.8)
Platelets: 352 10*3/uL (ref 145–400)
RBC: 4.59 10*6/uL (ref 3.70–5.32)

## 2008-02-27 LAB — COMPREHENSIVE METABOLIC PANEL
ALT: 16 U/L (ref 0–35)
Alkaline Phosphatase: 70 U/L (ref 39–117)
CO2: 25 mEq/L (ref 19–32)
Creatinine, Ser: 0.92 mg/dL (ref 0.40–1.20)
Total Bilirubin: 0.6 mg/dL (ref 0.3–1.2)

## 2008-02-27 LAB — CANCER ANTIGEN 27.29: CA 27.29: 43 U/mL — ABNORMAL HIGH (ref 0–39)

## 2008-02-27 LAB — LACTATE DEHYDROGENASE: LDH: 166 U/L (ref 94–250)

## 2008-04-07 ENCOUNTER — Ambulatory Visit: Payer: Self-pay | Admitting: Oncology

## 2008-04-09 LAB — CANCER ANTIGEN 27.29: CA 27.29: 45 U/mL — ABNORMAL HIGH (ref 0–39)

## 2008-04-20 ENCOUNTER — Ambulatory Visit (HOSPITAL_BASED_OUTPATIENT_CLINIC_OR_DEPARTMENT_OTHER): Admission: RE | Admit: 2008-04-20 | Discharge: 2008-04-20 | Payer: Self-pay | Admitting: General Surgery

## 2008-06-05 ENCOUNTER — Ambulatory Visit: Payer: Self-pay | Admitting: Oncology

## 2008-06-09 LAB — CBC WITH DIFFERENTIAL/PLATELET
Basophils Absolute: 0 10*3/uL (ref 0.0–0.1)
Eosinophils Absolute: 0.1 10*3/uL (ref 0.0–0.5)
HGB: 11.7 g/dL (ref 11.6–15.9)
LYMPH%: 44.4 % (ref 14.0–48.0)
MCV: 74.4 fL — ABNORMAL LOW (ref 81.0–101.0)
MONO%: 12 % (ref 0.0–13.0)
NEUT#: 1.6 10*3/uL (ref 1.5–6.5)
Platelets: 323 10*3/uL (ref 145–400)
RBC: 4.88 10*6/uL (ref 3.70–5.32)

## 2008-06-09 LAB — COMPREHENSIVE METABOLIC PANEL
Alkaline Phosphatase: 61 U/L (ref 39–117)
BUN: 12 mg/dL (ref 6–23)
Glucose, Bld: 82 mg/dL (ref 70–99)
Total Bilirubin: 0.7 mg/dL (ref 0.3–1.2)

## 2008-06-09 LAB — CANCER ANTIGEN 27.29: CA 27.29: 43 U/mL — ABNORMAL HIGH (ref 0–39)

## 2008-06-12 ENCOUNTER — Encounter: Admission: RE | Admit: 2008-06-12 | Discharge: 2008-06-12 | Payer: Self-pay | Admitting: Oncology

## 2008-09-01 ENCOUNTER — Ambulatory Visit: Payer: Self-pay | Admitting: Oncology

## 2008-10-28 ENCOUNTER — Ambulatory Visit: Payer: Self-pay | Admitting: Oncology

## 2008-10-30 LAB — CBC WITH DIFFERENTIAL/PLATELET
Eosinophils Absolute: 0.1 10*3/uL (ref 0.0–0.5)
MONO#: 0.4 10*3/uL (ref 0.1–0.9)
NEUT#: 2.8 10*3/uL (ref 1.5–6.5)
RBC: 4.97 10*6/uL (ref 3.70–5.32)
RDW: 19.5 % — ABNORMAL HIGH (ref 11.3–14.5)
WBC: 5 10*3/uL (ref 3.9–10.0)

## 2008-11-02 LAB — COMPREHENSIVE METABOLIC PANEL
ALT: 20 U/L (ref 0–35)
CO2: 23 mEq/L (ref 19–32)
Chloride: 105 mEq/L (ref 96–112)
Sodium: 142 mEq/L (ref 135–145)
Total Bilirubin: 0.6 mg/dL (ref 0.3–1.2)
Total Protein: 7.3 g/dL (ref 6.0–8.3)

## 2008-11-02 LAB — LACTATE DEHYDROGENASE: LDH: 159 U/L (ref 94–250)

## 2008-11-02 LAB — CANCER ANTIGEN 27.29: CA 27.29: 48 U/mL — ABNORMAL HIGH (ref 0–39)

## 2008-11-18 ENCOUNTER — Encounter: Admission: RE | Admit: 2008-11-18 | Discharge: 2008-11-18 | Payer: Self-pay | Admitting: Oncology

## 2009-01-22 ENCOUNTER — Other Ambulatory Visit: Admission: RE | Admit: 2009-01-22 | Discharge: 2009-01-22 | Payer: Self-pay | Admitting: Family Medicine

## 2009-04-26 ENCOUNTER — Ambulatory Visit: Payer: Self-pay | Admitting: Oncology

## 2009-04-28 LAB — CBC WITH DIFFERENTIAL/PLATELET
BASO%: 0.7 % (ref 0.0–2.0)
EOS%: 1.2 % (ref 0.0–7.0)
Eosinophils Absolute: 0.1 10*3/uL (ref 0.0–0.5)
MCV: 75.7 fL — ABNORMAL LOW (ref 79.5–101.0)
MONO%: 11.1 % (ref 0.0–14.0)
NEUT#: 2.4 10*3/uL (ref 1.5–6.5)
RBC: 5.23 10*6/uL (ref 3.70–5.45)
RDW: 16.8 % — ABNORMAL HIGH (ref 11.2–14.5)
nRBC: 0 % (ref 0–0)

## 2009-04-28 LAB — COMPREHENSIVE METABOLIC PANEL
ALT: 32 U/L (ref 0–35)
AST: 36 U/L (ref 0–37)
Alkaline Phosphatase: 67 U/L (ref 39–117)
CO2: 29 mEq/L (ref 19–32)
Sodium: 140 mEq/L (ref 135–145)
Total Bilirubin: 0.8 mg/dL (ref 0.3–1.2)
Total Protein: 7 g/dL (ref 6.0–8.3)

## 2009-04-29 LAB — VITAMIN D 25 HYDROXY (VIT D DEFICIENCY, FRACTURES): Vit D, 25-Hydroxy: 46 ng/mL (ref 30–89)

## 2009-11-19 ENCOUNTER — Encounter: Admission: RE | Admit: 2009-11-19 | Discharge: 2009-11-19 | Payer: Self-pay | Admitting: Oncology

## 2009-11-23 ENCOUNTER — Ambulatory Visit (HOSPITAL_COMMUNITY): Admission: RE | Admit: 2009-11-23 | Discharge: 2009-11-23 | Payer: Self-pay | Admitting: Oncology

## 2009-11-26 ENCOUNTER — Ambulatory Visit: Payer: Self-pay | Admitting: Oncology

## 2009-11-30 LAB — CBC WITH DIFFERENTIAL/PLATELET
EOS%: 1.2 % (ref 0.0–7.0)
Eosinophils Absolute: 0.1 10*3/uL (ref 0.0–0.5)
HGB: 13.3 g/dL (ref 11.6–15.9)
MCH: 26.2 pg (ref 25.1–34.0)
MCV: 80.5 fL (ref 79.5–101.0)
MONO%: 8.6 % (ref 0.0–14.0)
NEUT#: 5 10*3/uL (ref 1.5–6.5)
RBC: 5.08 10*6/uL (ref 3.70–5.45)
RDW: 15.3 % — ABNORMAL HIGH (ref 11.2–14.5)
lymph#: 1.9 10*3/uL (ref 0.9–3.3)

## 2009-11-30 LAB — COMPREHENSIVE METABOLIC PANEL
AST: 29 U/L (ref 0–37)
Albumin: 3.8 g/dL (ref 3.5–5.2)
Alkaline Phosphatase: 82 U/L (ref 39–117)
Calcium: 9.5 mg/dL (ref 8.4–10.5)
Chloride: 107 mEq/L (ref 96–112)
Potassium: 3.4 mEq/L — ABNORMAL LOW (ref 3.5–5.3)
Sodium: 140 mEq/L (ref 135–145)
Total Protein: 7.3 g/dL (ref 6.0–8.3)

## 2009-12-22 ENCOUNTER — Ambulatory Visit (HOSPITAL_COMMUNITY): Admission: RE | Admit: 2009-12-22 | Discharge: 2009-12-22 | Payer: Self-pay | Admitting: Oncology

## 2010-01-25 ENCOUNTER — Other Ambulatory Visit: Admission: RE | Admit: 2010-01-25 | Discharge: 2010-01-25 | Payer: Self-pay | Admitting: Family Medicine

## 2010-08-12 ENCOUNTER — Ambulatory Visit: Payer: Self-pay | Admitting: Oncology

## 2010-08-16 LAB — CBC WITH DIFFERENTIAL/PLATELET
Basophils Absolute: 0 10*3/uL (ref 0.0–0.1)
EOS%: 1.6 % (ref 0.0–7.0)
HCT: 45.4 % (ref 34.8–46.6)
HGB: 15.5 g/dL (ref 11.6–15.9)
LYMPH%: 42.9 % (ref 14.0–49.7)
MCH: 29.9 pg (ref 25.1–34.0)
MCHC: 34.2 g/dL (ref 31.5–36.0)
MCV: 87.3 fL (ref 79.5–101.0)
MONO%: 9.4 % (ref 0.0–14.0)
NEUT%: 45.6 % (ref 38.4–76.8)
Platelets: 276 10*3/uL (ref 145–400)

## 2010-08-17 LAB — COMPREHENSIVE METABOLIC PANEL
AST: 25 U/L (ref 0–37)
Alkaline Phosphatase: 58 U/L (ref 39–117)
BUN: 11 mg/dL (ref 6–23)
Calcium: 9.7 mg/dL (ref 8.4–10.5)
Chloride: 104 mEq/L (ref 96–112)
Creatinine, Ser: 0.91 mg/dL (ref 0.40–1.20)
Total Bilirubin: 0.9 mg/dL (ref 0.3–1.2)

## 2010-08-17 LAB — VITAMIN D 25 HYDROXY (VIT D DEFICIENCY, FRACTURES): Vit D, 25-Hydroxy: >120 ng/mL — ABNORMAL HIGH (ref 30–89)

## 2010-10-29 ENCOUNTER — Other Ambulatory Visit: Payer: Self-pay | Admitting: Oncology

## 2010-10-29 DIAGNOSIS — Z853 Personal history of malignant neoplasm of breast: Secondary | ICD-10-CM

## 2010-10-29 DIAGNOSIS — Z1239 Encounter for other screening for malignant neoplasm of breast: Secondary | ICD-10-CM

## 2010-10-29 DIAGNOSIS — Z780 Asymptomatic menopausal state: Secondary | ICD-10-CM

## 2010-11-21 ENCOUNTER — Ambulatory Visit
Admission: RE | Admit: 2010-11-21 | Discharge: 2010-11-21 | Disposition: A | Discharge status: Home / Self Care | Payer: 59 | Source: Ambulatory Visit | Attending: Oncology | Admitting: Oncology

## 2010-11-21 DIAGNOSIS — Z853 Personal history of malignant neoplasm of breast: Secondary | ICD-10-CM

## 2010-11-21 DIAGNOSIS — Z780 Asymptomatic menopausal state: Secondary | ICD-10-CM

## 2010-11-21 DIAGNOSIS — Z1239 Encounter for other screening for malignant neoplasm of breast: Secondary | ICD-10-CM

## 2011-02-09 ENCOUNTER — Other Ambulatory Visit: Payer: Self-pay | Admitting: Family Medicine

## 2011-02-09 ENCOUNTER — Other Ambulatory Visit (HOSPITAL_COMMUNITY)
Admission: RE | Admit: 2011-02-09 | Discharge: 2011-02-09 | Disposition: A | Discharge status: Home / Self Care | Payer: 59 | Source: Ambulatory Visit | Attending: Family Medicine | Admitting: Family Medicine

## 2011-02-09 DIAGNOSIS — Z124 Encounter for screening for malignant neoplasm of cervix: Secondary | ICD-10-CM | POA: Insufficient documentation

## 2011-02-21 NOTE — Discharge Summary (Signed)
NAME:  Caroline Mejia, Caroline Mejia NO.:  [id number]   MEDICAL RECORD NO.:  [id number]          PATIENT TYPE:  INP   LOCATION:  5148                         FACILITY:  [facility]   PHYSICIAN:  Etter Sjogren, M.D.     DATE OF BIRTH:  [birth year]   DATE OF ADMISSION:  10/22/2007  DATE OF DISCHARGE:  10/24/2007                               DISCHARGE SUMMARY   FINAL DIAGNOSIS:  Breast cancer, radiation to right chest.   PROCEDURE PERFORMED:  Right breast reconstruction using a transverse  rectus abdominis myocutaneous flap.   HISTORY OF PRESENT ILLNESS:  This 55 year old woman has had breast  cancer. She desired reconstruction. She has undergone radiation in the  past. The use of her own tissue was recommended because of the  radiation. She had decided on the TRAM flap. She had already had a delay  of that flap in the past and presented at this time for the transfer of  the flap for the breast reconstruction.   PAST MEDICAL HISTORY:  Negative and for further details of the history  and the physical, please see the chart.   HOSPITAL COURSE:  On admission, she was taken to surgery at which time  the reconstruction was performed using the TRAM flap. She tolerated that  procedure very well. Postoperatively, she remained afebrile. The flap  had excellent color, excellent capillary refill. She had excellent urine  output. Hemoglobin __________  on the first postoperative day. She was  tolerating a diet and ambulating. The second postoperative day she  continued to improve. Ambulation improved and pain control was excellent  on  by  mouth pain medication.  She was tolerating her diet and she was  ready for discharge.   DISPOSITION:  She was instructed to remove the On-Q pain pump the  following day and if she had any trouble to call the office. No lifting.  No vigorous activities. No showers yet. Empty the drain 3 times a day  and record the amounts. She was discharged on Percocet for  pain, 5 mg, a  total of 31, 2 p.o. q 4-6h  p.r.n. pain, Robaxin 500 mg, 1 every 8 hours  as needed for muscle spasm, and Keflex 500 mg p.o. q.i.d. and to  followup in the office next week.      Etter Sjogren, M.D.  Electronically Signed     DB/MEDQ  D:  12/04/2007  T:  12/04/2007  Job:  [id number]

## 2011-02-21 NOTE — Op Note (Signed)
NAME:  Caroline Mejia, Caroline Mejia NO.:  [id number]   MEDICAL RECORD NO.:  [id number]          PATIENT TYPE:  AMB   LOCATION:  DSC                          FACILITY:  [facility]   PHYSICIAN:  Angelia Mould. Derrell Lolling, M.D.DATE OF BIRTH:  [birth year]   DATE OF PROCEDURE:  04/20/2008  DATE OF DISCHARGE:                               OPERATIVE REPORT   PREOPERATIVE DIAGNOSIS:  Breast cancer.   POSTOPERATIVE DIAGNOSIS:  Breast cancer.   OPERATION PERFORMED:  Removal of Port-A-Cath.   SURGEON:  Angelia Mould. Derrell Lolling, MD   OPERATIVE INDICATIONS:  This is a 55 year old white female who had a  right partial mastectomy and sentinel node biopsy in August 2006.  She  developed a recurrence after chemotherapy and radiation and underwent a  right total mastectomy on December 27, 2006 and had a Port-A-Cath inserted.  She had a delayed TRAM reconstruction that has gone well.  Her left  mammogram on November 18, 2007 looks good.  She has been given the go  ahead to have her Port-A-Cath removed and has established herself with  me for breast cancer followup.  She has been counseled as an outpatient.  She is brought to the operating room electively.   OPERATIVE TECHNIQUE:  The patient was brought to the operating room at  [facility] and placed on the operating table with her arms  at her sides.  The patient was identified as correct patient and correct  procedure and correct site.  The left chest infraclavicular area was  prepped and draped in a sterile fashion.  Xylocaine 1% with epinephrine  was used as a local infiltration anesthetic.  This was done under local  anesthesia only.   A transverse incision was made in the left infraclavicular area, through  the previous scar.  Dissection was carried down to the port and the  catheter.  The port and the catheter were dissected away from the  underlying capsule.  There were 2 Prolene sutures that were cut and  removed.  The port and catheter were  then completely removed intact.  There was no bleeding.  The subcutaneous tissue was closed with  interrupted sutures of 3-0 Vicryl and the skin closed with a running  subcuticular suture of 4-0 Monocryl and Steri-Strips.  Clean bandages  were placed and the patient was taken to the recovery room in stable  condition.  Estimated blood loss was about 5 mL.  Complications none.  Sponge, needle, and instrument counts were correct.      Angelia Mould. Derrell Lolling, M.D.  Electronically Signed    HMI/MEDQ  D:  04/20/2008  T:  04/20/2008  Job:  [id number]   cc:   [name], MD  L. Lupe Carney, M.D.

## 2011-02-21 NOTE — Op Note (Signed)
NAME:  Caroline Mejia, DECHELLIS NO.:  [id number]   MEDICAL RECORD NO.:  [id number]          PATIENT TYPE:  INP   LOCATION:  2550                         FACILITY:  [facility]   PHYSICIAN:  Etter Sjogren, M.D.     DATE OF BIRTH:  09-Nov-1955   DATE OF PROCEDURE:  10/22/2007  DATE OF DISCHARGE:                               OPERATIVE REPORT   PREOPERATIVE DIAGNOSIS:  Right breast cancer.   POSTOPERATIVE DIAGNOSES:  Right breast cancer.   PROCEDURES:  1. The right breast reconstruction with ipsilateral transverse rectus      abdominis myocutaneous flap.  2. Placement of the On-Q pump.   SURGEON:  Etter Sjogren, M.D.   ASSISTANT:  Pleas Patricia, M.D.   ANESTHESIA:  General.   ESTIMATED BLOOD LOSS:  150 mL.   DRAINS:  Three Blakes:  One chest, two abdomen.   CLINICAL NOTE:  A 55 year old woman who has had breast cancer, has had a  mastectomy.  She is had previous radiation.  She had a delay procedure  of her TRAM flap and now she presents for transfer of the TRAM flap and  right breast reconstruction.  Procedure discussed with her in great  detail and she understood the risks and possible complications and  wished to proceed.   PROCEDURE:  The patient was placed in the full standing position in the  holding area and marked for the TRAM reconstruction.  She was then taken  to the operating room and placed supine position and underwent induction  of general anesthesia.  She was prepped with Betadine and draped with  sterile drapes.  The old mastectomy scar was opened and the mastectomy  defect was recreated to the dimensions previously, raising superior and  inferior breast flaps, taking care to avoid damage to the underlying  pectoralis major muscle.  Moist laps were placed.  Incision then made  around the umbilicus and a generous fatty stalk left at the umbilicus  especially on the left side, which was the non-pedicle side.  The upper  limb of the planned abdominal  incision was then made and dissection  carried down through the subcutaneous tissue, beveling in a cephalad  direction in order to capture more adipose tissue with the flap.  The  dissection continued superiorly and great care taken to avoid damage to  the underlying rectus muscle and a through-and-through tunnel was  created to the right chest.  The lower limb of the incision was then  made.  The flap was then elevated from the left side over to the midline  and from the right side over to the lateral row of perforators.  Parallel fascial incisions were then made through the anterior rectus  fascia, leaving a strip approximately 2.5 cm wide on the anterior  surface of the rectus muscle.  The rectus muscle was then gently  dissected free from its surrounding fascial attachments using the  scalpel as well as a bipolar cautery as needed.  The lateral branches  off the intercostals were then mobilized, double-ligated and divided.  Having freed the flap completely except for the muscle attachments, then  attention was directed back to the chest, which was irrigated thoroughly  with saline and meticulous hemostasis with the electrocautery.  A Blake  drain was positioned and brought through a separate stab wound  inferolaterally and secured with 3-0 Prolene suture.  The rectus muscle  was then divided and the flap was transferred to the chest.  There zone  4 was excised and there was bright red bleeding along where it was  excised, consistent with viability of the flap.  The flap was  temporarily secured with skin staples and covered for warmth.  It had  excellent color.  The attention was directed back to the abdomen.   The abdomen was irrigated with saline, meticulous hemostasis with  electrocautery especially on the cut edge of the rectus muscle  inferiorly.  The closure of the fascia with 0 Prolene interrupted figure-  of-eight sutures, taking great care to avoid damage to underlying  intra-  abdominal cavity.  Irrigation with saline.  [place]lay was then positioned  and secured with a 2-0 Prolene running simple suture around the  periphery with a hole cut in it in order to bring the umbilicus up  through that opening.  The umbilicus had excellent color.  Blake drains  were positioned and brought through separate stab wounds inferiorly and  secured with 3-0 Prolene sutures and the On-Q catheters were also  positioned and brought out inferiorly as well.  The skin was redraped,  the closure with 0 Vicryl interrupted inverted sutures at the midline, 2-  0 PDS interrupted inverted deep sutures for the Scarpa's, 3-0 Monocryl  interrupted buried deep dermal sutures and running 4-0 Monocryl  subcuticular suture.  The umbilicus brought through an opening as close  to the midline as possible because the umbilicus had been pulled  somewhat to the patient's right.  The umbilicus was inset with 3-0  Monocryl inverted deep dermal sutures.  The umbilicus did have excellent  color, as did the abdominal flap closure.   Attention directed to the chest.  The markings were placed for the flap  and it was then de-epithelialized as marked and secured superiorly with  suspension sutures, 3-0 Vicryl interrupted horizontal mattress sutures  up to the superior aspect of the mastectomy defect and then the  periphery of the flap inset with 3-0 Monocryl interrupted, inverted deep  dermal sutures and running 3-0 Monocryl subcuticular suture.  Again the  flap had excellent color and bright red bleeding from its periphery,  consistent with viability.  Steri-Strips, dry sterile dressing lightly  applied.  She was transported to the recovery room stable, having  tolerated the procedure.      Etter Sjogren, M.D.  Electronically Signed     DB/MEDQ  D:  10/22/2007  T:  10/22/2007  Job:  [id number]

## 2011-02-24 NOTE — Op Note (Signed)
NAME:  Caroline Mejia, Caroline Mejia NO.:  [id number]   MEDICAL RECORD NO.:  [id number]           PATIENT TYPE:   LOCATION:                                 FACILITY:   PHYSICIAN:  Rose Phi. Maple Hudson, M.D.   DATE OF BIRTH:  1956-06-11   DATE OF PROCEDURE:  01/16/2007  DATE OF DISCHARGE:                               OPERATIVE REPORT   PREOPERATIVE DIAGNOSIS:  Recurrent cancer of the right breast.   POSTOPERATIVE DIAGNOSIS:  Recurrent cancer of the right breast.   OPERATION:  Insertion of Port-A-Cath under fluoroscopic control.   SURGEON:  Rose Phi. Maple Hudson, M.D.   ANESTHESIA:  MAC.   OPERATIVE PROCEDURE:  The patient was placed on the operating table in  the supine position with the arms down by the side.  The patient was  turned to the right.  We prepped the left breast and left upper chest  and neck.   Under local anesthesia, the left subclavian puncture was carried out  without difficulty and the guidewire inserted.  Proper positioning of  the guidewire was confirmed by C-arm fluoroscopy.   We then made the incision on the anterior chest wall and used the old  one, and developed pocket for the implantable port.  We tunneled between  the subclavian puncture site and the pocket and passed the catheter  through that, connected to the port and placed the port in the pocket.   I then placed the catheter on the chest wall and measured it to go to  the fourth interspace, which would be the level of the cavoatrial  junction.   The dilator and peel-away sheath were then passed over the wire and the  wire was removed along with the dilator and the catheter passed through  the peel-away sheath and then it was removed.   Then at C-arm fluoroscopy, it was apparent the catheter actually gone  across to the right subclavian and then up into the internal jugular.   I then enlarged the incision in the subclavian area and grasped the  catheter and gently retracted it until the catheter  was out of the  internal jugular and then with gentle insertion it went down into the  superior vena cava in the proper position.   We then anchored the port in the pocket with two 2-0 Prolene sutures.  The catheter tip was felt to be near the cavoatrial junction but  definitely in the superior vena cava.   We closed the incisions with 3-0 Vicryl and subcuticular 4-0 Monocryl  and then Dermabond.  I accessed the catheter and aspirated it easily and  then fully heparinized it.   We then took the staples out of the mastectomy site on the right side.   A light dressing was placed on the left side and she was transferred to  the recovery room in satisfactory condition, having tolerated procedure  well.      Rose Phi. Maple Hudson, M.D.  Electronically Signed     PRY/MEDQ  D:  01/16/2007  T:  01/16/2007  Job:  [id number]

## 2011-02-24 NOTE — Op Note (Signed)
NAME:  Caroline Mejia, Caroline Mejia NO.:  [id number]   MEDICAL RECORD NO.:  [id number]          PATIENT TYPE:  INP   LOCATION:  5735                         FACILITY:  [facility]   PHYSICIAN:  Etter Sjogren, M.D.     DATE OF BIRTH:  [birth year]   DATE OF PROCEDURE:  12/27/2006  DATE OF DISCHARGE:                               OPERATIVE REPORT   PREOPERATIVE DIAGNOSIS:  Recurrent right breast cancer.   POSTOPERATIVE DIAGNOSIS:  Recurrent right breast cancer.   PROCEDURE:  Delay of a transverse rectus abdominis myocutaneous flap  with complex wound closure of the abdomen, greater than 30 cm.   SURGEON:  Etter Sjogren, M.D.   ANESTHESIA:  General.   ESTIMATED BLOOD LOSS:  Minimal.   CLINICAL NOTE:  This 55 year old woman has recurrent breast cancer.  She  previously had lumpectomy and radiation and has now had a recurrent  cancer and presents for mastectomy.  She is interested in reconstruction  and because of radiation, it was felt that we could use autogenous  tissue.  She selected the TRAM flap reconstruction.  Because of the size  of the flap that would be needed, we decided to run a delay and we  explained that reasoning process to her and she understood that, risks  and possible complications, overall convalescence, and wished to  proceed.   DESCRIPTION OF PROCEDURE:  The general surgery portion had been  completed by Dr. Maple Hudson without any complications.  An incision was made  in the lower abdomen and had been marked preoperatively.  Dissection was  carried through the subcutaneous tissue using electrocautery.  Meticulous hemostasis with electrocautery.  Incision then made in the  fascia just at the lateral border of the rectus muscle bilaterally.  Dissection carried down and the underlying deep inferior epigastric  vessels were identified and gently mobilized.  Double and triple  ligating vein and arteries, these vessels were divided.  The areas were  irrigated with  saline.  Excellent hemostasis was confirmed.  The fascia  was closed with 0 Prolene interrupted figure-of-eight sutures and the  wound was irrigated again with saline.  The skin was closed with a 2-0  Quill running suture.  Steri-Strips, dry sterile dressing, and an  abdominal binder applied.  Tolerated it well.  She will be observed  overnight.      Etter Sjogren, M.D.  Electronically Signed     DB/MEDQ  D:  12/27/2006  T:  12/27/2006  Job:  [id number]

## 2011-02-24 NOTE — Op Note (Signed)
NAME:  Caroline Mejia, SOPER NO.:  [id number]   MEDICAL RECORD NO.:  [id number]          PATIENT TYPE:  AMB   LOCATION:  DSC                          FACILITY:  [facility]   PHYSICIAN:  Rose Phi. Maple Hudson, M.D.   DATE OF BIRTH:  [birth year]   DATE OF PROCEDURE:  12/19/2004  DATE OF DISCHARGE:                                 OPERATIVE REPORT   PREOPERATIVE DIAGNOSIS:  Stage II carcinoma right breast.   POSTOPERATIVE DIAGNOSIS:  Stage II carcinoma right breast.   OPERATION:  Insertion of Port-A-Cath under flouroscopic control   SURGEON:  Rose Phi. Maple Hudson, M.D.   ANESTHESIA:  MAC   OPERATIVE PROCEDURE:  This 55 year old blood female presented with a fairly  large tumor measuring in excess of 3 cm in the right breast. It was elected  to do preoperative chemotherapy in that she very interested in breast  conservation and were placing the Port-A-Cath.   The patient placed on the operating table with a roll between the shoulders  and the arms down by the side. The left upper chest were and neck were  prepped and draped in usual fashion. Under local anesthesia, a left  subclavian puncture was carried out without difficulty and guidewire  inserted and proper positioning confirmed by fluoroscopy.   Incision was then made on the anterior chest wall and the pocket developed  for the implantable port. I then tunneled between the subclavian puncture  site and passed the catheter through there and then connected it to the port  and placed it in the pocket. I then measured the catheter length to go to  the fourth interspace at the level of the cavoatrial junction. Catheter was  divided at that point.   The dilator and peel-away sheath were then passed over the wire and the wire  removed followed by the dilator and catheter passed through the peel-away  sheath and then it was removed.   Fluoroscopy was again used to confirm the presence of the tip of the  catheter in the superior vena  cava and no kinking in the system.   Incisions were closed 3-0 Vicryl and subcuticular 4-0 Monocryl and Steri-  Strips.   I then accessed it and fully heparinized it without difficulty.   The needle was removed and dressings were applied. The patient transferred  to recovery room in satisfactory condition having tolerated procedure well.      PRY/MEDQ  D:  12/19/2004  T:  12/19/2004  Job:  [id number]

## 2011-02-24 NOTE — Op Note (Signed)
NAME:  Caroline Mejia, Caroline Mejia NO.:  [id number]   MEDICAL RECORD NO.:  [id number]          PATIENT TYPE:  AMB   LOCATION:  DSC                          FACILITY:  [facility]   PHYSICIAN:  Rose Phi. Maple Hudson, M.D.   DATE OF BIRTH:  06/07/1956   DATE OF PROCEDURE:  08/24/2005  DATE OF DISCHARGE:                                 OPERATIVE REPORT   PREOPERATIVE DIAGNOSIS:  Carcinoma of the breast.   POSTOPERATIVE DIAGNOSIS:  Carcinoma of the breast.   OPERATION:  Removal of Port-A-Cath.   SURGEON:  Rose Phi. Maple Hudson, M.D.   ANESTHESIA:  Local.   OPERATIVE PROCEDURE:  The patient was placed on the operating table and the  left upper chest prepped and draped in the usual fashion.  I infiltrated the  area of the previous incision for insertion of the port and then made the  incision and expose the port.  The catheter was grasped and removed from the  subclavian vein and then, with traction on it, the 2 sutures holding the  port and place were divided and the port removed.   With no bleeding, the incision was closed with a subcuticular 4-0 Monocryl  and Steri-Strips.  Dressing applied.  Patient transferred to the recovery  area and then allowed to go home.      Rose Phi. Maple Hudson, M.D.  Electronically Signed     PRY/MEDQ  D:  08/24/2005  T:  08/25/2005  Job:  [id number]

## 2011-02-24 NOTE — Op Note (Signed)
NAME:  Caroline Mejia, Caroline Mejia NO.:  [id number]   MEDICAL RECORD NO.:  [id number]          PATIENT TYPE:  AMB   LOCATION:  DSC                          FACILITY:  [facility]   PHYSICIAN:  Rose Phi. Maple Hudson, M.D.   DATE OF BIRTH:  [birth year]   DATE OF PROCEDURE:  05/11/2005  DATE OF DISCHARGE:                                 OPERATIVE REPORT   PREOPERATIVE DIAGNOSIS:  Stage I carcinoma, right breast.   POSTOPERATIVE DIAGNOSIS:  Stage I carcinoma, right breast.   OPERATION:  1.  Blue dye injection.  2.  Right partial mastectomy with needle localization and specimen      mammogram.  3.  Right sentinel lymph node biopsy.   SURGEON:  Rose Phi. Maple Hudson, M.D.   ANESTHESIA:  General.   OPERATIVE PROCEDURE:  This 55 year old female had presented in March with a  large mass at about the 11:30 position of her right breast.  She was ER/PR  negative.  She was treated with preoperative chemotherapy, at which time the  tumor became nonpalpable.  She has completed the chemo and is now scheduled  for definitive surgical treatment.   Prior to going to the operating room, a localizing wire had been placed in  the mass at the 11 to 12 o'clock position and then 1 mCi of technetium  sulfur colloid was injected intradermally.   After suitable general anesthesia was induced, the patient was placed in the  supine position with the arms extended on the arm board, and the right  breast and axilla were prepped and draped in the usual fashion.  Prior to  that, 5 mL of a mixture of 2 mL of methylene blue and 3 mL of injectable  saline was injected in the subareolar breast tissue and the breast gently  massaged for three minutes.  After the prepping and draping, I outlined a  curved incision centered at the previously-marked area at the 11:30 position  of the right breast along with the localizing wire.  The incision was made  and the wire delivered into the incision and then a wide excision of the  wire and surrounding tissue was carried out.  Specimen was oriented for the  pathologist.  It was submitted for specimen mammogram and then to the  pathologist for evaluation of margins.   While that was being done, a transverse right axillary incision was made  with dissection through the subcutaneous tissue to the clavipectoral fascia.  Deep to the fascia were two hot and blue nodes, which were removed as  sentinel nodes and sent to the pathologist.   Specimen mammogram confirmed the removal of the primary lesion, and  evaluation of the margins showed no evidence of tumor.  The sentinel nodes  were also reported as negative for metastatic disease.   With those reports, both incisions were injected with the local anesthetic  mixture and then closed in two layers of 3-0 Vicryl, subcuticular 4-0  Monocryl followed by Steri-Strips.   Dressings were then applied and the patient transferred to the recovery room  in satisfactory condition having tolerated the procedure well.  PRY/MEDQ  D:  05/11/2005  T:  05/11/2005  Job:  [id number]

## 2011-02-24 NOTE — Op Note (Signed)
NAME:  Caroline, Mejia NO.:  [id number]   MEDICAL RECORD NO.:  [id number]          PATIENT TYPE:  INP   LOCATION:  2899                         FACILITY:  [facility]   PHYSICIAN:  Rose Phi. Maple Hudson, M.D.   DATE OF BIRTH:  Jan 13, 1956   DATE OF PROCEDURE:  12/27/2006  DATE OF DISCHARGE:                               OPERATIVE REPORT   PREOPERATIVE DIAGNOSIS:  Recurrent carcinoma right breast.   POSTOPERATIVE DIAGNOSIS:  Recurrent carcinoma right breast.   OPERATION:  Right total mastectomy.   SURGEON:  Rose Phi. Maple Hudson, M.D.   ANESTHESIA:  General.   OPERATIVE PROCEDURE:  After suitable general anesthesia was induced, the  patient was placed in supine position with the arms extended on the arm  board and the entire chest wall from the neck and  including the  abdomen, down to the pubis was then made and we draped her out.   Transverse elliptical incision was then outlined around the right breast  incorporating the old lumpectomy scar as well as the nipple-areolar  complex.  Incisions were made and the flaps dissected in the standard  fashion going to near the clavicle superiorly, to the medial border of  the sternum medially and to the inframammary fold inferiorly and to the  latissimus dorsi muscle laterally.  We then removed the breast by  dissecting from medial to lateral and removing the specimen.  Hemostasis  obtained with cautery.  We thoroughly irrigated the field with saline.  One 19-French Harrison Mons drain was inserted and brought out through a  separate stab wound.  Skin was stapled.   Dr. Etter Sjogren was then to do the reconstruction and what he has  planned was a delayed abdominal flap and then complete the TRAM a later  date.  That will be dictated in separate note.      Rose Phi. Maple Hudson, M.D.  Electronically Signed     PRY/MEDQ  D:  12/27/2006  T:  12/27/2006  Job:  [id number]

## 2011-02-28 ENCOUNTER — Encounter (INDEPENDENT_AMBULATORY_CARE_PROVIDER_SITE_OTHER): Payer: Self-pay | Admitting: General Surgery

## 2011-03-10 ENCOUNTER — Encounter (HOSPITAL_BASED_OUTPATIENT_CLINIC_OR_DEPARTMENT_OTHER): Payer: 59 | Admitting: Oncology

## 2011-03-10 ENCOUNTER — Other Ambulatory Visit: Payer: Self-pay | Admitting: Oncology

## 2011-03-10 DIAGNOSIS — Z171 Estrogen receptor negative status [ER-]: Secondary | ICD-10-CM

## 2011-03-10 DIAGNOSIS — C50419 Malignant neoplasm of upper-outer quadrant of unspecified female breast: Secondary | ICD-10-CM

## 2011-03-10 LAB — CBC WITH DIFFERENTIAL/PLATELET
BASO%: 0.6 % (ref 0.0–2.0)
HCT: 43.4 % (ref 34.8–46.6)
MCHC: 33.7 g/dL (ref 31.5–36.0)
MONO#: 0.7 10*3/uL (ref 0.1–0.9)
NEUT#: 3.8 10*3/uL (ref 1.5–6.5)
NEUT%: 54.7 % (ref 38.4–76.8)
WBC: 6.9 10*3/uL (ref 3.9–10.3)
lymph#: 2.3 10*3/uL (ref 0.9–3.3)

## 2011-03-10 LAB — COMPREHENSIVE METABOLIC PANEL
ALT: 35 U/L (ref 0–35)
CO2: 26 mEq/L (ref 19–32)
Calcium: 9.2 mg/dL (ref 8.4–10.5)
Chloride: 103 mEq/L (ref 96–112)
Sodium: 140 mEq/L (ref 135–145)
Total Protein: 7.4 g/dL (ref 6.0–8.3)

## 2011-03-10 LAB — LACTATE DEHYDROGENASE: LDH: 185 U/L (ref 94–250)

## 2011-03-17 ENCOUNTER — Encounter (HOSPITAL_BASED_OUTPATIENT_CLINIC_OR_DEPARTMENT_OTHER): Payer: 59 | Admitting: Oncology

## 2011-03-17 DIAGNOSIS — Z171 Estrogen receptor negative status [ER-]: Secondary | ICD-10-CM

## 2011-03-17 DIAGNOSIS — C50419 Malignant neoplasm of upper-outer quadrant of unspecified female breast: Secondary | ICD-10-CM

## 2011-05-30 ENCOUNTER — Encounter (INDEPENDENT_AMBULATORY_CARE_PROVIDER_SITE_OTHER): Payer: Self-pay | Admitting: General Surgery

## 2011-06-28 LAB — BASIC METABOLIC PANEL
Calcium: 9.7
GFR calc Af Amer: >60
GFR calc Af Amer: >60
GFR calc non Af Amer: >60
GFR calc non Af Amer: >60
Glucose, Bld: 113 — ABNORMAL HIGH
Potassium: 3.4 — ABNORMAL LOW
Sodium: 135
Sodium: 141

## 2011-06-28 LAB — CBC
HCT: 29.8 — ABNORMAL LOW
HCT: 37.6
Hemoglobin: 12.4
Hemoglobin: 9.7 — ABNORMAL LOW
Platelets: 208
WBC: 6.1
WBC: 6.5

## 2011-06-28 LAB — URINE MICROSCOPIC-ADD ON

## 2011-06-28 LAB — URINALYSIS, ROUTINE W REFLEX MICROSCOPIC
Bilirubin Urine: NEGATIVE
Hgb urine dipstick: NEGATIVE
Specific Gravity, Urine: 1.01
pH: 5.5

## 2011-09-12 ENCOUNTER — Other Ambulatory Visit: Payer: Self-pay | Admitting: Oncology

## 2011-09-12 ENCOUNTER — Other Ambulatory Visit (HOSPITAL_BASED_OUTPATIENT_CLINIC_OR_DEPARTMENT_OTHER): Payer: 59 | Admitting: Lab

## 2011-09-12 DIAGNOSIS — C50419 Malignant neoplasm of upper-outer quadrant of unspecified female breast: Secondary | ICD-10-CM

## 2011-09-12 DIAGNOSIS — Z171 Estrogen receptor negative status [ER-]: Secondary | ICD-10-CM

## 2011-09-12 LAB — CBC WITH DIFFERENTIAL/PLATELET
Basophils Absolute: 0 10*3/uL (ref 0.0–0.1)
EOS%: 1.8 % (ref 0.0–7.0)
HCT: 44.7 % (ref 34.8–46.6)
HGB: 15.1 g/dL (ref 11.6–15.9)
MCH: 29.4 pg (ref 25.1–34.0)
MCV: 87.1 fL (ref 79.5–101.0)
NEUT%: 49.5 % (ref 38.4–76.8)
lymph#: 2.3 10*3/uL (ref 0.9–3.3)

## 2011-09-13 LAB — COMPREHENSIVE METABOLIC PANEL
AST: 33 U/L (ref 0–37)
BUN: 11 mg/dL (ref 6–23)
Calcium: 9.5 mg/dL (ref 8.4–10.5)
Chloride: 106 mEq/L (ref 96–112)
Creatinine, Ser: 1.02 mg/dL (ref 0.50–1.10)
Glucose, Bld: 110 mg/dL — ABNORMAL HIGH (ref 70–99)

## 2011-09-13 LAB — VITAMIN D 25 HYDROXY (VIT D DEFICIENCY, FRACTURES): Vit D, 25-Hydroxy: 66 ng/mL (ref 30–89)

## 2011-09-13 LAB — CANCER ANTIGEN 27.29: CA 27.29: 47 U/mL — ABNORMAL HIGH (ref 0–39)

## 2011-09-19 ENCOUNTER — Telehealth: Payer: Self-pay | Admitting: *Deleted

## 2011-09-19 ENCOUNTER — Ambulatory Visit (HOSPITAL_BASED_OUTPATIENT_CLINIC_OR_DEPARTMENT_OTHER): Payer: 59 | Admitting: Oncology

## 2011-09-19 VITALS — BP 138/87 | HR 62 | Temp 98.0°F | Ht 62.5 in | Wt 138.1 lb

## 2011-09-19 DIAGNOSIS — C50919 Malignant neoplasm of unspecified site of unspecified female breast: Secondary | ICD-10-CM

## 2011-09-19 DIAGNOSIS — Z171 Estrogen receptor negative status [ER-]: Secondary | ICD-10-CM

## 2011-09-19 DIAGNOSIS — C50419 Malignant neoplasm of upper-outer quadrant of unspecified female breast: Secondary | ICD-10-CM

## 2011-09-19 NOTE — Progress Notes (Signed)
Hematology and Oncology Follow Up Visit  Caroline Mejia [id number] 1956-09-09 55 y.o. 09/19/2011 12:33 PM   Principle Diagnosis:  55 year old woman, with hx of recurrent her2+ breast cancer in rt breast after previous Rx forbreast cancer on same side that was her2 negative. Last herceptin given in 2010, Hx of rt tram flap Interim History:  Doing well, no complaints, continues to work, f/u mamm in 2/13  Medications: I have reviewed the patient's current medications.  Allergies: No Known Allergies  Past Medical History, Surgical history, Social history, and Family History were reviewed and updated.  Review of Systems: Constitutional:  Negative for fever, chills, night sweats, anorexia, weight loss, pain. Cardiovascular: no chest pain or dyspnea on exertion Respiratory: no cough, shortness of breath, or wheezing Neurological: negative Dermatological: negative ENT: negative Skin Gastrointestinal: no abdominal pain, change in bowel habits, or black or bloody stools Genito-Urinary: no dysuria, trouble voiding, or hematuria Hematological and Lymphatic: negative Breast: negative for breast lumps Musculoskeletal: negative Remaining ROS negative.  Physical Exam: Blood pressure 138/87, pulse 62, temperature 98 F (36.7 C), temperature source Oral, height 5' 2.5" (1.588 m), weight 138 lb 1.6 oz (62.642 kg). ECOG: 0 General appearance: alert, cooperative and appears stated age Head: Normocephalic, without obvious abnormality, atraumatic Neck: no adenopathy, no carotid bruit, no JVD, supple, symmetrical, trachea midline and thyroid not enlarged, symmetric, no tenderness/mass/nodules Lymph nodes: Cervical, supraclavicular, and axillary nodes normal. Cardiac : nl Pulmonary:nl Breasts: rt tram, lt breast nl..both axilla negative Abdomen:nl Extremities nl Neuro: nl  Lab Results: Lab Results  Component Value Date   WBC 5.6 09/12/2011   HGB 15.1 09/12/2011   HCT 44.7 09/12/2011   MCV  87.1 09/12/2011   PLT 255 09/12/2011     Chemistry      Component Value Date/Time   NA 141 09/12/2011 1336   NA 141 09/12/2011 1336   NA 141 09/12/2011 1336   NA 141 09/12/2011 1336   K 3.7 09/12/2011 1336   K 3.7 09/12/2011 1336   K 3.7 09/12/2011 1336   K 3.7 09/12/2011 1336   CL 106 09/12/2011 1336   CL 106 09/12/2011 1336   CL 106 09/12/2011 1336   CL 106 09/12/2011 1336   CO2 27 09/12/2011 1336   CO2 27 09/12/2011 1336   CO2 27 09/12/2011 1336   CO2 27 09/12/2011 1336   BUN 11 09/12/2011 1336   BUN 11 09/12/2011 1336   BUN 11 09/12/2011 1336   BUN 11 09/12/2011 1336   CREATININE 1.02 09/12/2011 1336   CREATININE 1.02 09/12/2011 1336   CREATININE 1.02 09/12/2011 1336   CREATININE 1.02 09/12/2011 1336      Component Value Date/Time   CALCIUM 9.5 09/12/2011 1336   CALCIUM 9.5 09/12/2011 1336   CALCIUM 9.5 09/12/2011 1336   CALCIUM 9.5 09/12/2011 1336   ALKPHOS 50 09/12/2011 1336   ALKPHOS 50 09/12/2011 1336   ALKPHOS 50 09/12/2011 1336   ALKPHOS 50 09/12/2011 1336   AST 33 09/12/2011 1336   AST 33 09/12/2011 1336   AST 33 09/12/2011 1336   AST 33 09/12/2011 1336   ALT 28 09/12/2011 1336   ALT 28 09/12/2011 1336   ALT 28 09/12/2011 1336   ALT 28 09/12/2011 1336   BILITOT 1.2 09/12/2011 1336   BILITOT 1.2 09/12/2011 1336   BILITOT 1.2 09/12/2011 1336   BILITOT 1.2 09/12/2011 1336       Radiological Studies: chest X-ray n/a Mammogram Due 2/13 Bone density Due 2014  Impression  and Plan: Hx of recurrent ipsilateral her2+ breast cancer s/p Rx , on f/u..will see in 6 months.  More than 50% of the visit was spent in patient-related counselling   [name], MD 12/11/201212:33 PM

## 2011-09-19 NOTE — Telephone Encounter (Signed)
gave patient appointment for 03-2012 printed out calendar and gave to the patient 

## 2011-11-01 ENCOUNTER — Other Ambulatory Visit: Payer: Self-pay | Admitting: Oncology

## 2011-11-01 DIAGNOSIS — Z1231 Encounter for screening mammogram for malignant neoplasm of breast: Secondary | ICD-10-CM

## 2011-11-01 DIAGNOSIS — Z9011 Acquired absence of right breast and nipple: Secondary | ICD-10-CM

## 2011-11-23 ENCOUNTER — Ambulatory Visit
Admission: RE | Admit: 2011-11-23 | Discharge: 2011-11-23 | Disposition: A | Discharge status: Home / Self Care | Payer: 59 | Source: Ambulatory Visit | Attending: Oncology | Admitting: Oncology

## 2011-11-23 DIAGNOSIS — Z9011 Acquired absence of right breast and nipple: Secondary | ICD-10-CM

## 2011-11-23 DIAGNOSIS — Z1231 Encounter for screening mammogram for malignant neoplasm of breast: Secondary | ICD-10-CM

## 2012-03-12 ENCOUNTER — Other Ambulatory Visit (HOSPITAL_BASED_OUTPATIENT_CLINIC_OR_DEPARTMENT_OTHER): Payer: 59 | Admitting: Lab

## 2012-03-12 ENCOUNTER — Other Ambulatory Visit: Payer: Self-pay | Admitting: *Deleted

## 2012-03-12 DIAGNOSIS — C50919 Malignant neoplasm of unspecified site of unspecified female breast: Secondary | ICD-10-CM

## 2012-03-12 LAB — COMPREHENSIVE METABOLIC PANEL
ALT: 17 U/L (ref 0–35)
AST: 23 U/L (ref 0–37)
CO2: 29 mEq/L (ref 19–32)
Calcium: 9.6 mg/dL (ref 8.4–10.5)
Chloride: 105 mEq/L (ref 96–112)
Sodium: 141 mEq/L (ref 135–145)
Total Protein: 6.9 g/dL (ref 6.0–8.3)

## 2012-03-12 LAB — CBC WITH DIFFERENTIAL/PLATELET
BASO%: 1 % (ref 0.0–2.0)
Eosinophils Absolute: 0.1 10*3/uL (ref 0.0–0.5)
MONO#: 0.5 10*3/uL (ref 0.1–0.9)
NEUT#: 2.2 10*3/uL (ref 1.5–6.5)
Platelets: 277 10*3/uL (ref 145–400)
RBC: 4.56 10*6/uL (ref 3.70–5.45)
RDW: 13.2 % (ref 11.2–14.5)
WBC: 4.8 10*3/uL (ref 3.9–10.3)
lymph#: 1.9 10*3/uL (ref 0.9–3.3)
nRBC: 0 % (ref 0–0)

## 2012-03-12 LAB — CANCER ANTIGEN 27.29: CA 27.29: 39 U/mL (ref 0–39)

## 2012-03-19 ENCOUNTER — Telehealth: Payer: Self-pay | Admitting: *Deleted

## 2012-03-19 ENCOUNTER — Ambulatory Visit (HOSPITAL_BASED_OUTPATIENT_CLINIC_OR_DEPARTMENT_OTHER): Payer: 59 | Admitting: Oncology

## 2012-03-19 VITALS — BP 134/87 | HR 76 | Temp 98.6°F | Ht 62.5 in | Wt 143.2 lb

## 2012-03-19 DIAGNOSIS — C50919 Malignant neoplasm of unspecified site of unspecified female breast: Secondary | ICD-10-CM

## 2012-03-19 DIAGNOSIS — Z171 Estrogen receptor negative status [ER-]: Secondary | ICD-10-CM

## 2012-03-19 DIAGNOSIS — C50419 Malignant neoplasm of upper-outer quadrant of unspecified female breast: Secondary | ICD-10-CM

## 2012-03-19 NOTE — Telephone Encounter (Signed)
gave patient appointment for 03-2013 with the lab one week before the md visit printed out calendar and gave to the patient

## 2012-03-19 NOTE — Telephone Encounter (Signed)
gave patient appointment for 03-2013 with the lab one week before the md visit printed out calendar and gave to the patient 

## 2012-03-19 NOTE — Progress Notes (Signed)
Hematology and Oncology Follow Up Visit  NIKAELA COYNE [id number] 30-Nov-1955 56 y.o. 03/19/2012 9:39 AM   Principle Diagnosis:  57 year old woman, with hx of recurrent her2+ breast cancer in rt breast after previous Rx fo rbreast cancer on same side that was her2 negative. Last herceptin given in 2010, Hx of rt, tram flap Interim History:  Doing well, no complaints, continues to work, f/u mamm in 2/13 was normal  Medications: I have reviewed the patient's current medications.  Allergies: No Known Allergies  Past Medical History, Surgical history, Social history, and Family History were reviewed and updated.  Review of Systems: Constitutional:  Negative for fever, chills, night sweats, anorexia, weight loss, pain. Cardiovascular: no chest pain or dyspnea on exertion Respiratory: no cough, shortness of breath, or wheezing Neurological: negative Dermatological: negative ENT: negative Skin Gastrointestinal: no abdominal pain, change in bowel habits, or black or bloody stools Genito-Urinary: no dysuria, trouble voiding, or hematuria Hematological and Lymphatic: negative Breast: negative for breast lumps Musculoskeletal: negative Remaining ROS negative.  Physical Exam: Blood pressure 134/87, pulse 76, temperature 98.6 F (37 C), height 5' 2.5" (1.588 m), weight 143 lb 3.2 oz (64.955 kg). ECOG: 0 General appearance: alert, cooperative and appears stated age Head: Normocephalic, without obvious abnormality, atraumatic Neck: no adenopathy, no carotid bruit, no JVD, supple, symmetrical, trachea midline and thyroid not enlarged, symmetric, no tenderness/mass/nodules Lymph nodes: Cervical, supraclavicular, and axillary nodes normal. Cardiac : nl Pulmonary:nl Breasts: rt tram, lt breast nl..both axilla negative Abdomen:nl Extremities nl Neuro: nl  Lab Results: Lab Results  Component Value Date   WBC 4.8 03/12/2012   HGB 13.2 03/12/2012   HCT 40.4 03/12/2012   MCV 88.7 03/12/2012   PLT 277 03/12/2012     Chemistry      Component Value Date/Time   NA 141 03/12/2012 1557   K 3.9 03/12/2012 1557   CL 105 03/12/2012 1557   CO2 29 03/12/2012 1557   BUN 12 03/12/2012 1557   CREATININE 0.94 03/12/2012 1557      Component Value Date/Time   CALCIUM 9.6 03/12/2012 1557   ALKPHOS 58 03/12/2012 1557   AST 23 03/12/2012 1557   ALT 17 03/12/2012 1557   BILITOT 0.6 03/12/2012 1557       Radiological Studies: chest X-ray n/a Mammogram Due 2/14 Bone density Due 2014  Impression and Plan: Hx of recurrent ipsilateral her2+ breast cancer s/p Rx , on f/u..will see in 12 months. She is clinically doing well, I will see her in 1 year, with appropriate imaging studies.  More than 50% of the visit was spent in patient-related counselling   [name], MD 6/11/20139:39 AM

## 2012-10-21 ENCOUNTER — Other Ambulatory Visit: Payer: Self-pay | Admitting: Family Medicine

## 2012-10-21 DIAGNOSIS — Z1231 Encounter for screening mammogram for malignant neoplasm of breast: Secondary | ICD-10-CM

## 2012-10-21 DIAGNOSIS — Z9011 Acquired absence of right breast and nipple: Secondary | ICD-10-CM

## 2012-11-25 ENCOUNTER — Ambulatory Visit
Admission: RE | Admit: 2012-11-25 | Discharge: 2012-11-25 | Disposition: A | Discharge status: Home / Self Care | Payer: 59 | Source: Ambulatory Visit | Attending: Family Medicine | Admitting: Family Medicine

## 2012-11-25 ENCOUNTER — Other Ambulatory Visit: Payer: Self-pay | Admitting: Family Medicine

## 2012-11-25 DIAGNOSIS — Z9011 Acquired absence of right breast and nipple: Secondary | ICD-10-CM

## 2012-11-25 DIAGNOSIS — R928 Other abnormal and inconclusive findings on diagnostic imaging of breast: Secondary | ICD-10-CM

## 2012-11-25 DIAGNOSIS — Z1231 Encounter for screening mammogram for malignant neoplasm of breast: Secondary | ICD-10-CM

## 2012-12-05 ENCOUNTER — Other Ambulatory Visit: Payer: Self-pay | Admitting: Family Medicine

## 2012-12-05 ENCOUNTER — Ambulatory Visit
Admission: RE | Admit: 2012-12-05 | Discharge: 2012-12-05 | Disposition: A | Discharge status: Home / Self Care | Payer: 59 | Source: Ambulatory Visit | Attending: Family Medicine | Admitting: Family Medicine

## 2012-12-05 DIAGNOSIS — R928 Other abnormal and inconclusive findings on diagnostic imaging of breast: Secondary | ICD-10-CM

## 2012-12-05 DIAGNOSIS — N6320 Unspecified lump in the left breast, unspecified quadrant: Secondary | ICD-10-CM

## 2012-12-07 HISTORY — PX: BREAST BIOPSY: SHX20

## 2012-12-11 ENCOUNTER — Ambulatory Visit
Admission: RE | Admit: 2012-12-11 | Discharge: 2012-12-11 | Disposition: A | Discharge status: Home / Self Care | Payer: 59 | Source: Ambulatory Visit | Attending: Family Medicine | Admitting: Family Medicine

## 2012-12-11 ENCOUNTER — Other Ambulatory Visit (HOSPITAL_COMMUNITY): Payer: Self-pay | Admitting: Diagnostic Radiology

## 2012-12-11 ENCOUNTER — Other Ambulatory Visit: Payer: Self-pay | Admitting: Family Medicine

## 2012-12-11 ENCOUNTER — Other Ambulatory Visit: Payer: Self-pay | Admitting: Diagnostic Radiology

## 2012-12-11 DIAGNOSIS — N6320 Unspecified lump in the left breast, unspecified quadrant: Secondary | ICD-10-CM

## 2012-12-11 DIAGNOSIS — R928 Other abnormal and inconclusive findings on diagnostic imaging of breast: Secondary | ICD-10-CM

## 2012-12-11 DIAGNOSIS — I714 Abdominal aortic aneurysm, without rupture: Secondary | ICD-10-CM

## 2012-12-19 ENCOUNTER — Ambulatory Visit (INDEPENDENT_AMBULATORY_CARE_PROVIDER_SITE_OTHER): Payer: 59 | Admitting: General Surgery

## 2012-12-19 ENCOUNTER — Encounter (INDEPENDENT_AMBULATORY_CARE_PROVIDER_SITE_OTHER): Payer: Self-pay | Admitting: General Surgery

## 2012-12-19 VITALS — BP 140/82 | HR 92 | Temp 97.0°F | Resp 18 | Ht 62.5 in | Wt 148.8 lb

## 2012-12-19 DIAGNOSIS — C50919 Malignant neoplasm of unspecified site of unspecified female breast: Secondary | ICD-10-CM

## 2012-12-19 DIAGNOSIS — C50912 Malignant neoplasm of unspecified site of left female breast: Secondary | ICD-10-CM

## 2012-12-19 NOTE — Progress Notes (Signed)
Patient ID: Caroline Mejia, female   DOB: Apr 11, 1956, 57 y.o.   MRN: [id number]  Chief Complaint  Patient presents with  . New Evaluation    br ca    HPI Caroline Mejia is a 57 y.o. female.  She is referred back to me by Dr. Donnie Coffin her at the breast center for a newly diagnosed cancer in the left breast, upper outer quadrant. Her primary care physician is Dr. Adelene Amas. Dr. [name] was her oncologist. Dr. Etter Sjogren is her plastic surgeon.  She has a significant history of right partial mastectomy and node dissection in 2006 by Dr. Maple Hudson, followed by chemotherapy and radiation therapy. She had in breast recurrence and had right total mastectomy by Dr. Francina Ames. She then underwent stem cell Transplant and chemotherapy. She had delayed TRAM on the right and a reduction on the left. I removed her Port-A-Cath in 2009. She was last seen by me in followup on 04/22/2010 at which time she was doing fine.   She has no complaints about her breast, but recent mammograms showed a 9 mm mass in the left breast at the 1:00 position, 10 cm from the nipple. Image guided biopsy shows invasive mammary carcinoma and calcifications, invasive ductal cancer is favored. ER 100%, PR 100%t, Ki-67 8%, HER-2-negative.  She is here today with her mother and sister.  Family history is negative for breast cancer but reveals ovarian cancer and a maternal grandmother and a maternal aunt.  The patient states that she had genetic testing in 2000 made and it was negative.  She is essentially healthy. HPI  Past Medical History  Diagnosis Date  . Cancer     breast  . Breast cancer     Past Surgical History  Procedure Laterality Date  . Tubal ligation    . Breast lumpectomy  2008  . Breast surgery  2008    Rt Br removed    Family History  Problem Relation Age of Onset  . Heart disease Mother   . Heart disease Father   . Cancer Father     Social History History  Substance Use Topics  .  Smoking status: Never Smoker   . Smokeless tobacco: Never Used  . Alcohol Use: 0.6 oz/week    1 Glasses of wine per week     Comment: Weekly.    No Known Allergies  Current Outpatient Prescriptions  Medication Sig Dispense Refill  . calcium carbonate (OS-CAL) 600 MG TABS Take 600 mg by mouth 2 (two) times daily with a meal.        . fish oil-omega-3 fatty acids 1000 MG capsule Take 2 g by mouth daily.        Marland Kitchen omeprazole (PRILOSEC) 20 MG capsule Take 20 mg by mouth daily.        . simvastatin (ZOCOR) 40 MG tablet Take 40 mg by mouth at bedtime.         No current facility-administered medications for this visit.    Review of Systems Review of Systems  Constitutional: Negative for fever, chills and unexpected weight change.  HENT: Negative for hearing loss, congestion, sore throat, trouble swallowing and voice change.   Eyes: Negative for visual disturbance.  Respiratory: Negative for cough and wheezing.   Cardiovascular: Negative for chest pain, palpitations and leg swelling.  Gastrointestinal: Negative for nausea, vomiting, abdominal pain, diarrhea, constipation, blood in stool, abdominal distention and anal bleeding.  Genitourinary: Negative for hematuria, vaginal bleeding and difficulty urinating.  Musculoskeletal: Negative for arthralgias.  Skin: Negative for rash and wound.  Neurological: Negative for seizures, syncope and headaches.  Hematological: Negative for adenopathy. Does not bruise/bleed easily.  Psychiatric/Behavioral: Negative for confusion.    Blood pressure 140/82, pulse 92, temperature 97 F (36.1 C), temperature source Temporal, resp. rate 18, height 5' 2.5" (1.588 m), weight 148 lb 12.8 oz (67.495 kg).  Physical Exam Physical Exam  Constitutional: She is oriented to person, place, and time. She appears well-developed and well-nourished. No distress.  HENT:  Head: Normocephalic and atraumatic.  Nose: Nose normal.  Mouth/Throat: No oropharyngeal exudate.   Eyes: Conjunctivae and EOM are normal. Pupils are equal, round, and reactive to light. Left eye exhibits no discharge. No scleral icterus.  Neck: Neck supple. No JVD present. No tracheal deviation present. No thyromegaly present.  Cardiovascular: Normal rate, regular rhythm, normal heart sounds and intact distal pulses.   No murmur heard. Pulmonary/Chest: Effort normal and breath sounds normal. No respiratory distress. She has no wheezes. She has no rales. She exhibits no tenderness.  Well healed TRAM reconstruction on the right and right axillary incision. All skin feels healthy. No nodules or ulcerations or adenopathy. Left breast reveals reduction scars. Small bruise upper outer quadrant, no mass, no axillary adenopathy.  Abdominal: Soft. Bowel sounds are normal. She exhibits no distension and no mass. There is no tenderness. There is no rebound and no guarding.  Transverse lower abdominal incision and umbilical incision from TRAM flap.  Musculoskeletal: She exhibits no edema and no tenderness.  Lymphadenopathy:    She has no cervical adenopathy.  Neurological: She is alert and oriented to person, place, and time. She exhibits normal muscle tone. Coordination normal.  Skin: Skin is warm. No rash noted. She is not diaphoretic. No erythema. No pallor.  Psychiatric: She has a normal mood and affect. Her behavior is normal. Judgment and thought content normal.    Data Reviewed Imaging studies. Histology and breast diagnostic protocol. Extensive old records.  Assessment    Invasive mammary carcinoma left breast, receptor positive, HER-2-negative. 9 mm, upper outer quadrant. Clinical stage TI B., N0  History HER-2 positive cancer right breast. Right partial mastectomy and node dissection 2006. Followed by a breast recurrence, salvage mastectomy, sterile cell transplant, and delayed TRAM all right and reduction of left  Removal of Port-A-Cath 2009  Genetic testing negative, per patient  report , 2008    Plan    I had a long discussion with the patient and her family members about lumpectomy and sentinel node biopsy, mastectomy and sentinel nodes, immediate reconstruction, delayed reconstruction. We talked about referral to a medical oncologist preop and she declined that stating she would do it postop. Basically she had already decided that she wanted to go ahead with a mastectomy, and if possible immediate reconstruction with Dr. Odis Luster.  She will be scheduled for a left total mastectomy and sentinel node biopsy.  I've discussed indications, details, techniques, and numerous risk of the surgery with her and her family. She understands all these issues and all of her questions rancher. She agrees with this plan.  We presented her case in breast conference yesterday. Dr. Michell Heinrich felt that she would be appropriate for immediate reconstruction from her radiation oncology standpoint.  We have called Dr. Guy Franco office and she will be seen by him early next week for consultation.       Angelia Mould. Derrell Lolling, M.D., [facility] General and Minimally invasive Surgery Breast and Colorectal  Surgery Office:   [phone] Pager:   [phone]  12/19/2012, 10:30 AM

## 2012-12-19 NOTE — Patient Instructions (Signed)
You have recently been diagnosed with a new cancer in the upper outer quadrant of the left breast. The tumor is 9 mm in size, probably invasive ductal carcinoma. Estrogen and progesterone receptors are strongly positive, HER-2 is negative.  We talked about all of your options, and you have decided that you want a left mastectomy with sentinel node biopsy, and if possible, immediate reconstruction.  You will be referred immediately to Dr. Etter Sjogren.  We will schedule the surgery in the near future.  You will be referred to medical oncology after surgery. If you change you mind and would like a preoperative medical oncology consultation,Let us know and we will arrange that.      .Mastectomy, With or Without Reconstruction Mastectomy (removal of the breast) is a procedure most commonly used to treat cancer (tumor) of the breast. Different procedures are available for treatment. This depends on the stage of the tumor (abnormal growths). Discuss this with your caregiver, surgeon (a specialist for performing operations such as this), or oncologist (someone specialized in the treatment of cancer). With proper information, you can decide which treatment is best for you. Although the sound of the word cancer is frightening to all of [place], the new treatments and medications can be a source of reassurance and comfort. If there are things you are worried about, discuss them with your caregiver. He or she can help comfort you and your family. Some of the different procedures for treating breast cancer are:  Radical (extensive) mastectomy. This is an operation used to remove the entire breast, the muscles under the breast, and all of the glands (lymph nodes) under the arm. With all of the new treatments available for cancer of the breast, this procedure has become less common.  Modified radical mastectomy. This is a similar operation to the radical mastectomy described above. In the modified radical  mastectomy, the muscles of the chest wall are not removed unless one of the lessor muscles is removed. One of the lessor muscles may be removed to allow better removal of the lymph nodes. The axillary lymph nodes are also removed. Rarely, during an axillary node dissection nerves to this area are damaged. Radiation therapy is then often used to the area following this surgery.  A total mastectomy also known as a complete or simple mastectomy. It involves removal of only the breast. The lymph nodes and the muscles are left in place.  In a lumpectomy, the lump is removed from the breast. This is the simplest form of surgical treatment. A sentinel lymph node biopsy may also be done. Additional treatment may be required. RISKS AND COMPLICATIONS The main problems that follow removal of the breast include:  Infection (germs start growing in the wound). This can usually be treated with antibiotics (medications that kill germs).  Lymphedema. This means the arm on the side of the breast that was operated on swells because the lymph (tissue fluid) cannot follow the main channels back into the body. This only occurs when the lymph nodes have had to be removed under the arm.  There may be some areas of numbness to the upper arm and around the incision (cut by the surgeon) in the breast. This happens because of the cutting of or damage to some of the nerves in the area. This is most often unavoidable.  There may be difficultymoving the arm in a full range of motion (moving in all directions) following surgery. This usually improves with time following use and exercise.  Recurrence of breast cancer may happen with the very best of surgery and follow up treatment. Sometimes small cancer cells that cannot be seen with the naked eye have already spread at the time of surgery. When this happens other treatment is available. This treatment may be radiation, medications or a combination of  both. RECONSTRUCTION Reconstruction of the breast may be done immediately if there is not going to be post-operative radiation. This surgery is done for cosmetic (improve appearance) purposes to improve the physical appearance after the operation. This may be done in two ways:  It can be done using a saline filled prosthetic (an artificial breast which is filled with salt water). Silicone breast implants are now re-approved by the FDA and are being commonly used.  Reconstruction can be done using the body's own muscle/fat/skin. Your caregiver will discuss your options with you. Depending upon your needs or choice, together you will be able to determine which procedure is best for you. Document Released: 06/20/2001 Document Revised: 12/18/2011 Document Reviewed: 02/11/2008 [facility] Patient Information 2013 [place], [place].

## 2012-12-28 ENCOUNTER — Telehealth: Payer: Self-pay | Admitting: Oncology

## 2012-12-28 NOTE — Telephone Encounter (Signed)
, °

## 2013-01-13 ENCOUNTER — Encounter (HOSPITAL_COMMUNITY): Payer: Self-pay | Admitting: Pharmacy Technician

## 2013-01-14 ENCOUNTER — Other Ambulatory Visit: Payer: Self-pay | Admitting: Plastic Surgery

## 2013-01-20 NOTE — Pre-Procedure Instructions (Signed)
Caroline Mejia  01/20/2013   Your procedure is scheduled on:  April 22  Report to [facility] at 05:30 AM.  Call this number if you have problems the morning of surgery: [phone]   Remember:   Do not eat food or drink liquids after midnight.   Take these medicines the morning of surgery with A SIP OF WATER: Omeprazole   STOP Fish oil and calcium after today  Do not wear jewelry, make-up or nail polish.  Do not wear lotions, powders, or perfumes. You may wear deodorant.  Do not shave 48 hours prior to surgery. Men may shave face and neck.  Do not bring valuables to the hospital.  Contacts, dentures or bridgework may not be worn into surgery.  Leave suitcase in the car. After surgery it may be brought to your room.  For patients admitted to the hospital, checkout time is 11:00 AM the day of  discharge.   Patients discharged the day of surgery will not be allowed to drive  home.  Name and phone number of your driver: Family/ Friend  Special Instructions: Shower using CHG 2 nights before surgery and the night before surgery.  If you shower the day of surgery use CHG.  Use special wash - you have one bottle of CHG for all showers.  You should use approximately 1/3 of the bottle for each shower.   Please read over the following fact sheets that you were given: Pain Booklet, Coughing and Deep Breathing and Surgical Site Infection Prevention

## 2013-01-21 ENCOUNTER — Encounter (HOSPITAL_COMMUNITY): Payer: Self-pay

## 2013-01-21 ENCOUNTER — Encounter (HOSPITAL_COMMUNITY)
Admission: RE | Admit: 2013-01-21 | Discharge: 2013-01-21 | Disposition: A | Discharge status: Home / Self Care | Payer: 59 | Source: Ambulatory Visit | Attending: General Surgery | Admitting: General Surgery

## 2013-01-21 DIAGNOSIS — J9819 Other pulmonary collapse: Secondary | ICD-10-CM | POA: Insufficient documentation

## 2013-01-21 DIAGNOSIS — C50919 Malignant neoplasm of unspecified site of unspecified female breast: Secondary | ICD-10-CM | POA: Insufficient documentation

## 2013-01-21 DIAGNOSIS — Z01818 Encounter for other preprocedural examination: Secondary | ICD-10-CM | POA: Insufficient documentation

## 2013-01-21 DIAGNOSIS — K449 Diaphragmatic hernia without obstruction or gangrene: Secondary | ICD-10-CM | POA: Insufficient documentation

## 2013-01-21 HISTORY — DX: Gastro-esophageal reflux disease without esophagitis: K21.9

## 2013-01-21 LAB — CBC WITH DIFFERENTIAL/PLATELET
Basophils Absolute: 0 10*3/uL (ref 0.0–0.1)
Lymphocytes Relative: 50 % — ABNORMAL HIGH (ref 12–46)
Neutro Abs: 2 10*3/uL (ref 1.7–7.7)
Neutrophils Relative %: 40 % — ABNORMAL LOW (ref 43–77)
Platelets: 267 10*3/uL (ref 150–400)
RDW: 13.2 % (ref 11.5–15.5)
WBC: 5 10*3/uL (ref 4.0–10.5)

## 2013-01-21 LAB — COMPREHENSIVE METABOLIC PANEL
ALT: 19 U/L (ref 0–35)
AST: 25 U/L (ref 0–37)
CO2: 29 mEq/L (ref 19–32)
Chloride: 106 mEq/L (ref 96–112)
GFR calc non Af Amer: 67 mL/min — ABNORMAL LOW (ref >90)
Sodium: 143 mEq/L (ref 135–145)
Total Bilirubin: 0.9 mg/dL (ref 0.3–1.2)

## 2013-01-21 LAB — CANCER ANTIGEN 27.29: CA 27.29: 48 U/mL — ABNORMAL HIGH (ref 0–39)

## 2013-01-21 MED ORDER — CHLORHEXIDINE GLUCONATE 4 % EX LIQD: 1.0000 "application " | Freq: Once | CUTANEOUS | Status: DC

## 2013-01-22 MED ORDER — ACETAMINOPHEN 10 MG/ML IV SOLN
INTRAVENOUS | Status: AC
  Filled 2013-01-22: qty 100

## 2013-01-27 MED ORDER — CEFAZOLIN SODIUM-DEXTROSE 2-3 GM-% IV SOLR
2.0000 g | INTRAVENOUS | Status: AC
  Administered 2013-01-28 (×2): 2 g via INTRAVENOUS
  Filled 2013-01-27: qty 50

## 2013-01-27 NOTE — H&P (Signed)
Caroline Mejia   MRN:  [id number]   Description: 57 year old female  Provider: Ernestene Mention, MD  Department: [facility]       Diagnoses    Breast CA, left    -  Primary    174.9      Reason for Visit    New Evaluation    br ca        Current Vitals    BP Pulse Temp(Src) Resp Ht Wt    140/82 92 97 F (36.1 C) (Temporal) 18 5' 2.5" (1.588 m) 148 lb 12.8 oz (67.495 kg)     BMI - 26.77 kg/m2                History and Physical   Ernestene Mention, MD    Status: Signed                           HPI Caroline Mejia is a 57 y.o. female.  She is referred back to me by Dr. Donnie Coffin her at the breast center for a newly diagnosed cancer in the left breast, upper outer quadrant. Her primary care physician is Dr. Adelene Amas. Dr. [name] was her oncologist. Dr. Etter Sjogren is her plastic surgeon.   She has a significant history of right partial mastectomy and node dissection in 2006 by Dr. Maple Hudson, followed by chemotherapy and radiation therapy. She had in breast recurrence and had right total mastectomy by Dr. Francina Ames. She then underwent stem cell Transplant and chemotherapy. She had delayed TRAM on the right and a reduction on the left. I removed her Port-A-Cath in 2009. She was last seen by me in followup on 04/22/2010 at which time she was doing fine.    She has no complaints about her breast, but recent mammograms showed a 9 mm mass in the left breast at the 1:00 position, 10 cm from the nipple. Image guided biopsy shows invasive mammary carcinoma and calcifications, invasive ductal cancer is favored. ER 100%, PR 100%t, Ki-67 8%, HER-2-negative.   She is here today with her mother and sister.   Family history is negative for breast cancer but reveals ovarian cancer and a maternal grandmother and a maternal aunt.  The patient states that she had genetic testing in 2000 made and it was negative.  She is essentially healthy.         Past  Medical History   Diagnosis  Date   .  Cancer         breast   .  Breast cancer           Past Surgical History   Procedure  Laterality  Date   .  Tubal ligation       .  Breast lumpectomy    2008   .  Breast surgery    2008       Rt Br removed         Family History   Problem  Relation  Age of Onset   .  Heart disease  Mother     .  Heart disease  Father     .  Cancer  Father          Social History History   Substance Use Topics   .  Smoking status:  Never Smoker    .  Smokeless tobacco:  Never Used   .  Alcohol Use:  0.6 oz/week       1 Glasses of wine per week         Comment: Weekly.        No Known Allergies    Current Outpatient Prescriptions   Medication  Sig  Dispense  Refill   .  calcium carbonate (OS-CAL) 600 MG TABS  Take 600 mg by mouth 2 (two) times daily with a meal.           .  fish oil-omega-3 fatty acids 1000 MG capsule  Take 2 g by mouth daily.           Marland Kitchen  omeprazole (PRILOSEC) 20 MG capsule  Take 20 mg by mouth daily.           .  simvastatin (ZOCOR) 40 MG tablet  Take 40 mg by mouth at bedtime.               No current facility-administered medications for this visit.        Review of Systems  Constitutional: Negative for fever, chills and unexpected weight change.  HENT: Negative for hearing loss, congestion, sore throat, trouble swallowing and voice change.   Eyes: Negative for visual disturbance.  Respiratory: Negative for cough and wheezing.   Cardiovascular: Negative for chest pain, palpitations and leg swelling.  Gastrointestinal: Negative for nausea, vomiting, abdominal pain, diarrhea, constipation, blood in stool, abdominal distention and anal bleeding.  Genitourinary: Negative for hematuria, vaginal bleeding and difficulty urinating.  Musculoskeletal: Negative for arthralgias.  Skin: Negative for rash and wound.  Neurological: Negative for seizures, syncope and headaches.  Hematological: Negative for adenopathy.  Does not bruise/bleed easily.  Psychiatric/Behavioral: Negative for confusion.      Blood pressure 140/82, pulse 92, temperature 97 F (36.1 C), temperature source Temporal, resp. rate 18, height 5' 2.5" (1.588 m), weight 148 lb 12.8 oz (67.495 kg).   Physical Exam  Constitutional: She is oriented to person, place, and time. She appears well-developed and well-nourished. No distress.  HENT:   Head: Normocephalic and atraumatic.   Nose: Nose normal.   Mouth/Throat: No oropharyngeal exudate.  Eyes: Conjunctivae and EOM are normal. Pupils are equal, round, and reactive to light. Left eye exhibits no discharge. No scleral icterus.  Neck: Neck supple. No JVD present. No tracheal deviation present. No thyromegaly present.  Cardiovascular: Normal rate, regular rhythm, normal heart sounds and intact distal pulses.    No murmur heard. Pulmonary/Chest: Effort normal and breath sounds normal. No respiratory distress. She has no wheezes. She has no rales. She exhibits no tenderness.  Well healed TRAM reconstruction on the right and right axillary incision. All skin feels healthy. No nodules or ulcerations or adenopathy. Left breast reveals reduction scars. Small bruise upper outer quadrant, no mass, no axillary adenopathy.  Abdominal: Soft. Bowel sounds are normal. She exhibits no distension and no mass. There is no tenderness. There is no rebound and no guarding.  Transverse lower abdominal incision and umbilical incision from TRAM flap.  Musculoskeletal: She exhibits no edema and no tenderness.  Lymphadenopathy:    She has no cervical adenopathy.  Neurological: She is alert and oriented to person, place, and time. She exhibits normal muscle tone. Coordination normal.  Skin: Skin is warm. No rash noted. She is not diaphoretic. No erythema. No pallor.  Psychiatric: She has a normal mood and affect. Her behavior is normal. Judgment and thought content normal.      Data Reviewed Imaging  studies. Histology and breast diagnostic  protocol. Extensive old records.   Assessment    Invasive mammary carcinoma left breast, receptor positive, HER-2-negative. 9 mm, upper outer quadrant. Clinical stage TI B., N0   History HER-2 positive cancer right breast. Right partial mastectomy and node dissection 2006. Followed by a breast recurrence, salvage mastectomy, sterile cell transplant, and delayed TRAM all right and reduction of left   Removal of Port-A-Cath 2009   Genetic testing negative, per patient report , 2008     Plan    I had a long discussion with the patient and her family members about lumpectomy and sentinel node biopsy, mastectomy and sentinel nodes, immediate reconstruction, delayed reconstruction. We talked about referral to a medical oncologist preop and she declined that stating she would do it postop. Basically she had already decided that she wanted to go ahead with a mastectomy, and if possible immediate reconstruction with Dr. Odis Luster.   She will be scheduled for a left total mastectomy and sentinel node biopsy.   I've discussed indications, details, techniques, and numerous risk of the surgery with her and her family. She understands all these issues and all of her questions rancher. She agrees with this plan.   We presented her case in breast conference yesterday. Dr. Michell Heinrich felt that she would be appropriate for immediate reconstruction from her radiation oncology standpoint.   We have called Dr. Guy Franco office and she will be seen by him  for consultation.          Angelia Mould. Derrell Lolling, M.D., [facility] General and Minimally invasive Surgery Breast and Colorectal Surgery Office:   [phone] Pager:   [phone]

## 2013-01-28 ENCOUNTER — Encounter (HOSPITAL_COMMUNITY): Payer: Self-pay | Admitting: Surgery

## 2013-01-28 ENCOUNTER — Ambulatory Visit (HOSPITAL_COMMUNITY): Payer: 59 | Admitting: Certified Registered"

## 2013-01-28 ENCOUNTER — Inpatient Hospital Stay (HOSPITAL_COMMUNITY)
Admission: RE | Admit: 2013-01-28 | Discharge: 2013-01-30 | DRG: 581 | Disposition: A | Discharge status: Home / Self Care | Payer: 59 | Source: Ambulatory Visit | Attending: General Surgery | Admitting: General Surgery

## 2013-01-28 ENCOUNTER — Encounter (HOSPITAL_COMMUNITY)
Admission: RE | Disposition: A | Discharge status: Home / Self Care | Payer: Self-pay | Source: Ambulatory Visit | Attending: General Surgery

## 2013-01-28 ENCOUNTER — Encounter (HOSPITAL_COMMUNITY): Payer: Self-pay | Admitting: Certified Registered"

## 2013-01-28 ENCOUNTER — Encounter (HOSPITAL_COMMUNITY)
Admission: RE | Admit: 2013-01-28 | Discharge: 2013-01-28 | Disposition: A | Discharge status: Home / Self Care | Payer: 59 | Source: Ambulatory Visit | Attending: General Surgery | Admitting: General Surgery

## 2013-01-28 DIAGNOSIS — C50912 Malignant neoplasm of unspecified site of left female breast: Secondary | ICD-10-CM

## 2013-01-28 DIAGNOSIS — Z9010 Acquired absence of unspecified breast and nipple: Secondary | ICD-10-CM

## 2013-01-28 DIAGNOSIS — C50919 Malignant neoplasm of unspecified site of unspecified female breast: Secondary | ICD-10-CM | POA: Diagnosis present

## 2013-01-28 DIAGNOSIS — C50419 Malignant neoplasm of upper-outer quadrant of unspecified female breast: Principal | ICD-10-CM | POA: Diagnosis present

## 2013-01-28 DIAGNOSIS — D0590 Unspecified type of carcinoma in situ of unspecified breast: Secondary | ICD-10-CM

## 2013-01-28 HISTORY — PX: LATISSIMUS FLAP TO BREAST: SHX5357

## 2013-01-28 HISTORY — DX: Personal history of other diseases of the digestive system: Z87.19

## 2013-01-28 HISTORY — PX: MASTECTOMY W/ SENTINEL NODE BIOPSY: SHX2001

## 2013-01-28 HISTORY — PX: MASTECTOMY: SHX3

## 2013-01-28 HISTORY — PX: RECONSTRUCTION BREAST W/ LATISSIMUS DORSI FLAP: SUR1078

## 2013-01-28 SURGERY — MASTECTOMY WITH SENTINEL LYMPH NODE BIOPSY
Anesthesia: General | Site: Chest | Laterality: Left | Wound class: Clean

## 2013-01-28 MED ORDER — SODIUM CHLORIDE 0.9 % IR SOLN
Status: DC | PRN
  Administered 2013-01-28 (×2): 2000 mL

## 2013-01-28 MED ORDER — CEFAZOLIN SODIUM 1-5 GM-% IV SOLN
1.0000 g | Freq: Three times a day (TID) | INTRAVENOUS | Status: DC
  Administered 2013-01-28 – 2013-01-30 (×6): 1 g via INTRAVENOUS
  Filled 2013-01-28 (×8): qty 50

## 2013-01-28 MED ORDER — FENTANYL CITRATE 0.05 MG/ML IJ SOLN
INTRAMUSCULAR | Status: DC | PRN
  Administered 2013-01-28: 100 ug via INTRAVENOUS
  Administered 2013-01-28 (×3): 50 ug via INTRAVENOUS
  Administered 2013-01-28: 75 ug via INTRAVENOUS
  Administered 2013-01-28: 50 ug via INTRAVENOUS
  Administered 2013-01-28: 75 ug via INTRAVENOUS
  Administered 2013-01-28 (×4): 50 ug via INTRAVENOUS
  Administered 2013-01-28: 100 ug via INTRAVENOUS

## 2013-01-28 MED ORDER — TECHNETIUM TC 99M SULFUR COLLOID FILTERED
1.0000 | Freq: Once | INTRAVENOUS | Status: AC | PRN
  Administered 2013-01-28: 1 via INTRADERMAL

## 2013-01-28 MED ORDER — HEPARIN SODIUM (PORCINE) 5000 UNIT/ML IJ SOLN: 5000.0000 [IU] | Freq: Once | INTRAMUSCULAR | Status: AC

## 2013-01-28 MED ORDER — ARTIFICIAL TEARS OP OINT
TOPICAL_OINTMENT | OPHTHALMIC | Status: DC | PRN
  Administered 2013-01-28: 1 via OPHTHALMIC

## 2013-01-28 MED ORDER — NALOXONE HCL 0.4 MG/ML IJ SOLN: 0.4000 mg | INTRAMUSCULAR | Status: DC | PRN

## 2013-01-28 MED ORDER — LIDOCAINE HCL (CARDIAC) 20 MG/ML IV SOLN
INTRAVENOUS | Status: DC | PRN
  Administered 2013-01-28: 50 mg via INTRAVENOUS

## 2013-01-28 MED ORDER — LIDOCAINE HCL 4 % MT SOLN
OROMUCOSAL | Status: DC | PRN
  Administered 2013-01-28: 4 mL via TOPICAL

## 2013-01-28 MED ORDER — METHYLENE BLUE 1 % INJ SOLN
INTRAMUSCULAR | Status: AC
  Filled 2013-01-28: qty 10

## 2013-01-28 MED ORDER — GLYCOPYRROLATE 0.2 MG/ML IJ SOLN
INTRAMUSCULAR | Status: DC | PRN
  Administered 2013-01-28: 0.3 mg via INTRAVENOUS

## 2013-01-28 MED ORDER — PANTOPRAZOLE SODIUM 40 MG PO TBEC
40.0000 mg | DELAYED_RELEASE_TABLET | Freq: Every day | ORAL | Status: DC
  Administered 2013-01-29 – 2013-01-30 (×2): 40 mg via ORAL
  Filled 2013-01-28 (×3): qty 1

## 2013-01-28 MED ORDER — CEFAZOLIN SODIUM-DEXTROSE 2-3 GM-% IV SOLR
2.0000 g | INTRAVENOUS | Status: DC
  Filled 2013-01-28: qty 50

## 2013-01-28 MED ORDER — PROPOFOL 10 MG/ML IV BOLUS
INTRAVENOUS | Status: DC | PRN
  Administered 2013-01-28: 200 mg via INTRAVENOUS

## 2013-01-28 MED ORDER — HYDROMORPHONE HCL PF 1 MG/ML IJ SOLN
0.2500 mg | INTRAMUSCULAR | Status: DC | PRN
  Administered 2013-01-28 (×2): 0.5 mg via INTRAVENOUS

## 2013-01-28 MED ORDER — METHOCARBAMOL 500 MG PO TABS
500.0000 mg | ORAL_TABLET | Freq: Three times a day (TID) | ORAL | Status: DC
  Administered 2013-01-28 – 2013-01-30 (×6): 500 mg via ORAL
  Filled 2013-01-28 (×8): qty 1

## 2013-01-28 MED ORDER — MIDAZOLAM HCL 5 MG/5ML IJ SOLN
INTRAMUSCULAR | Status: DC | PRN
  Administered 2013-01-28: 2 mg via INTRAVENOUS

## 2013-01-28 MED ORDER — PHENYLEPHRINE HCL 10 MG/ML IJ SOLN
INTRAMUSCULAR | Status: DC | PRN
  Administered 2013-01-28 (×8): 80 ug via INTRAVENOUS
  Administered 2013-01-28: 160 ug via INTRAVENOUS
  Administered 2013-01-28: 120 ug via INTRAVENOUS
  Administered 2013-01-28: 80 ug via INTRAVENOUS
  Administered 2013-01-28: 120 ug via INTRAVENOUS
  Administered 2013-01-28: 80 ug via INTRAVENOUS
  Administered 2013-01-28: 120 ug via INTRAVENOUS

## 2013-01-28 MED ORDER — LACTATED RINGERS IV SOLN
INTRAVENOUS | Status: DC | PRN
  Administered 2013-01-28 (×3): via INTRAVENOUS

## 2013-01-28 MED ORDER — PROMETHAZINE HCL 25 MG/ML IJ SOLN: 6.2500 mg | INTRAMUSCULAR | Status: DC | PRN

## 2013-01-28 MED ORDER — HEPARIN SODIUM (PORCINE) 5000 UNIT/ML IJ SOLN
INTRAMUSCULAR | Status: AC
  Administered 2013-01-28: 5000 [IU] via SUBCUTANEOUS
  Filled 2013-01-28: qty 1

## 2013-01-28 MED ORDER — SIMVASTATIN 40 MG PO TABS
40.0000 mg | ORAL_TABLET | Freq: Every day | ORAL | Status: DC
  Administered 2013-01-28 – 2013-01-29 (×2): 40 mg via ORAL
  Filled 2013-01-28 (×3): qty 1

## 2013-01-28 MED ORDER — EPHEDRINE SULFATE 50 MG/ML IJ SOLN
INTRAMUSCULAR | Status: DC | PRN
  Administered 2013-01-28: 10 mg via INTRAVENOUS
  Administered 2013-01-28: 5 mg via INTRAVENOUS
  Administered 2013-01-28: 10 mg via INTRAVENOUS
  Administered 2013-01-28: 5 mg via INTRAVENOUS
  Administered 2013-01-28: 20 mg via INTRAVENOUS

## 2013-01-28 MED ORDER — ONDANSETRON HCL 4 MG/2ML IJ SOLN
INTRAMUSCULAR | Status: DC | PRN
  Administered 2013-01-28: 4 mg via INTRAVENOUS

## 2013-01-28 MED ORDER — SODIUM CHLORIDE 0.9 % IR SOLN
Status: DC | PRN
  Administered 2013-01-28: 09:00:00

## 2013-01-28 MED ORDER — HYDROMORPHONE 0.3 MG/ML IV SOLN
INTRAVENOUS | Status: DC
  Administered 2013-01-28 – 2013-01-29 (×2): 0.999 mg via INTRAVENOUS
  Administered 2013-01-29: 0.599 mg via INTRAVENOUS

## 2013-01-28 MED ORDER — ONDANSETRON HCL 4 MG/2ML IJ SOLN: 4.0000 mg | Freq: Four times a day (QID) | INTRAMUSCULAR | Status: DC | PRN

## 2013-01-28 MED ORDER — ONDANSETRON HCL 4 MG/2ML IJ SOLN: 4.0000 mg | Freq: Once | INTRAMUSCULAR | Status: DC | PRN

## 2013-01-28 MED ORDER — ROCURONIUM BROMIDE 100 MG/10ML IV SOLN
INTRAVENOUS | Status: DC | PRN
  Administered 2013-01-28: 30 mg via INTRAVENOUS
  Administered 2013-01-28: 10 mg via INTRAVENOUS
  Administered 2013-01-28: 50 mg via INTRAVENOUS
  Administered 2013-01-28: 10 mg via INTRAVENOUS
  Administered 2013-01-28: 20 mg via INTRAVENOUS

## 2013-01-28 MED ORDER — DEXTROSE-NACL 5-0.45 % IV SOLN
INTRAVENOUS | Status: DC
  Administered 2013-01-28: 100 mL/h via INTRAVENOUS
  Administered 2013-01-29: 02:00:00 via INTRAVENOUS

## 2013-01-28 MED ORDER — HYDROMORPHONE 0.3 MG/ML IV SOLN
INTRAVENOUS | Status: AC
  Administered 2013-01-28: 15:00:00
  Filled 2013-01-28: qty 25

## 2013-01-28 MED ORDER — SODIUM CHLORIDE 0.9 % IJ SOLN: 9.0000 mL | INTRAMUSCULAR | Status: DC | PRN

## 2013-01-28 MED ORDER — DOCUSATE SODIUM 100 MG PO CAPS
100.0000 mg | ORAL_CAPSULE | Freq: Every day | ORAL | Status: DC
  Administered 2013-01-28 – 2013-01-30 (×3): 100 mg via ORAL
  Filled 2013-01-28 (×3): qty 1

## 2013-01-28 MED ORDER — SODIUM CHLORIDE 0.9 % IJ SOLN
INTRAMUSCULAR | Status: DC | PRN
  Administered 2013-01-28: 09:00:00 via INTRAMUSCULAR

## 2013-01-28 MED ORDER — HYDROMORPHONE HCL PF 1 MG/ML IJ SOLN
INTRAMUSCULAR | Status: AC
  Filled 2013-01-28: qty 1

## 2013-01-28 MED ORDER — NEOSTIGMINE METHYLSULFATE 1 MG/ML IJ SOLN
INTRAMUSCULAR | Status: DC | PRN
  Administered 2013-01-28: 2 mg via INTRAVENOUS

## 2013-01-28 MED ORDER — DIPHENHYDRAMINE HCL 50 MG/ML IJ SOLN: 12.5000 mg | Freq: Four times a day (QID) | INTRAMUSCULAR | Status: DC | PRN

## 2013-01-28 MED ORDER — DIPHENHYDRAMINE HCL 12.5 MG/5ML PO ELIX: 12.5000 mg | ORAL_SOLUTION | Freq: Four times a day (QID) | ORAL | Status: DC | PRN

## 2013-01-28 SURGICAL SUPPLY — 87 items
APPLIER CLIP 9.375 MED OPEN (MISCELLANEOUS) ×4
ATCH SMKEVC FLXB CAUT HNDSWH (FILTER) ×1 IMPLANT
BAG DECANTER FOR FLEXI CONT (MISCELLANEOUS) ×2 IMPLANT
BINDER BREAST LRG (GAUZE/BANDAGES/DRESSINGS) IMPLANT
BINDER BREAST XLRG (GAUZE/BANDAGES/DRESSINGS) ×2 IMPLANT
BIOPATCH RED 1 DISK 7.0 (GAUZE/BANDAGES/DRESSINGS) ×4 IMPLANT
BLADE SURG 15 STRL LF DISP TIS (BLADE) ×1 IMPLANT
BLADE SURG 15 STRL SS (BLADE) ×1
CANISTER SUCTION 2500CC (MISCELLANEOUS) ×4 IMPLANT
CHLORAPREP W/TINT 26ML (MISCELLANEOUS) ×4 IMPLANT
CLIP APPLIE 9.375 MED OPEN (MISCELLANEOUS) ×2 IMPLANT
CLOTH BEACON ORANGE TIMEOUT ST (SAFETY) ×4 IMPLANT
CONT SPEC 4OZ CLIKSEAL STRL BL (MISCELLANEOUS) ×2 IMPLANT
COVER PROBE W GEL 5X96 (DRAPES) ×2 IMPLANT
COVER SURGICAL LIGHT HANDLE (MISCELLANEOUS) ×4 IMPLANT
DERMABOND ADVANCED (GAUZE/BANDAGES/DRESSINGS) ×2
DERMABOND ADVANCED .7 DNX12 (GAUZE/BANDAGES/DRESSINGS) ×2 IMPLANT
DRAIN CHANNEL 19F RND (DRAIN) ×6 IMPLANT
DRAPE INCISE 23X17 IOBAN STRL (DRAPES) ×1
DRAPE INCISE IOBAN 23X17 STRL (DRAPES) ×1 IMPLANT
DRAPE LAPAROSCOPIC ABDOMINAL (DRAPES) ×2 IMPLANT
DRAPE ORTHO SPLIT 77X108 STRL (DRAPES) ×4
DRAPE PROXIMA HALF (DRAPES) ×10 IMPLANT
DRAPE SURG 17X23 STRL (DRAPES) ×16 IMPLANT
DRAPE SURG ORHT 6 SPLT 77X108 (DRAPES) ×4 IMPLANT
DRAPE UTILITY 15X26 W/TAPE STR (DRAPE) ×4 IMPLANT
DRAPE WARM FLUID 44X44 (DRAPE) ×2 IMPLANT
DRSG PAD ABDOMINAL 8X10 ST (GAUZE/BANDAGES/DRESSINGS) ×10 IMPLANT
DRSG TEGADERM 4X4.75 (GAUZE/BANDAGES/DRESSINGS) ×4 IMPLANT
ELECT BLADE 4.0 EZ CLEAN MEGAD (MISCELLANEOUS) ×4
ELECT BLADE 6.5 EXT (BLADE) IMPLANT
ELECT CAUTERY BLADE 6.4 (BLADE) ×6 IMPLANT
ELECT REM PT RETURN 9FT ADLT (ELECTROSURGICAL) ×4
ELECTRODE BLDE 4.0 EZ CLN MEGD (MISCELLANEOUS) ×2 IMPLANT
ELECTRODE REM PT RTRN 9FT ADLT (ELECTROSURGICAL) ×2 IMPLANT
EVACUATOR SILICONE 100CC (DRAIN) ×8 IMPLANT
EVACUATOR SMOKE ACCUVAC VALLEY (FILTER) ×1
GAUZE XEROFORM 5X9 LF (GAUZE/BANDAGES/DRESSINGS) IMPLANT
GLOVE BIO SURGEON STRL SZ7.5 (GLOVE) ×6 IMPLANT
GLOVE BIOGEL PI IND STRL 6.5 (GLOVE) ×6 IMPLANT
GLOVE BIOGEL PI IND STRL 8 (GLOVE) ×2 IMPLANT
GLOVE BIOGEL PI INDICATOR 6.5 (GLOVE) ×6
GLOVE BIOGEL PI INDICATOR 8 (GLOVE) ×2
GLOVE EUDERMIC 7 POWDERFREE (GLOVE) ×4 IMPLANT
GLOVE SURG SS PI 7.0 STRL IVOR (GLOVE) ×8 IMPLANT
GOWN PREVENTION PLUS XLARGE (GOWN DISPOSABLE) ×8 IMPLANT
GOWN STRL NON-REIN LRG LVL3 (GOWN DISPOSABLE) ×8 IMPLANT
GOWN STRL REIN XL XLG (GOWN DISPOSABLE) ×14 IMPLANT
IMPLANT SALINE ROUND 525CC (Breast) ×2 IMPLANT
KIT BASIN OR (CUSTOM PROCEDURE TRAY) ×4 IMPLANT
KIT ROOM TURNOVER OR (KITS) ×4 IMPLANT
MARKER SKIN DUAL TIP RULER LAB (MISCELLANEOUS) ×6 IMPLANT
MENTOR IMPLANT SIZER 475CC ×2 IMPLANT
NEEDLE 18GX1X1/2 (RX/OR ONLY) (NEEDLE) ×2 IMPLANT
NEEDLE 21 GA WING INFUSION (NEEDLE) ×2 IMPLANT
NEEDLE HYPO 25GX1X1/2 BEV (NEEDLE) ×4 IMPLANT
NS IRRIG 1000ML POUR BTL (IV SOLUTION) ×12 IMPLANT
PACK GENERAL/GYN (CUSTOM PROCEDURE TRAY) ×4 IMPLANT
PAD ARMBOARD 7.5X6 YLW CONV (MISCELLANEOUS) ×8 IMPLANT
PENCIL BUTTON HOLSTER BLD 10FT (ELECTRODE) ×2 IMPLANT
PREFILTER EVAC NS 1 1/3-3/8IN (MISCELLANEOUS) ×2 IMPLANT
SET ASEPTIC TRANSFER (MISCELLANEOUS) ×2 IMPLANT
SPECIMEN JAR LARGE (MISCELLANEOUS) ×2 IMPLANT
SPONGE GAUZE 4X4 12PLY (GAUZE/BANDAGES/DRESSINGS) ×2 IMPLANT
SPONGE LAP 18X18 X RAY DECT (DISPOSABLE) ×6 IMPLANT
STAPLER VISISTAT 35W (STAPLE) ×6 IMPLANT
STRIP CLOSURE SKIN 1/2X4 (GAUZE/BANDAGES/DRESSINGS) ×2 IMPLANT
SUT ETHILON 3 0 FSL (SUTURE) ×2 IMPLANT
SUT MNCRL AB 3-0 PS2 18 (SUTURE) ×12 IMPLANT
SUT MNCRL AB 4-0 PS2 18 (SUTURE) ×2 IMPLANT
SUT MON AB 2-0 CT1 36 (SUTURE) ×2 IMPLANT
SUT PDS AB 0 CT 36 (SUTURE) ×4 IMPLANT
SUT PROLENE 3 0 PS 1 (SUTURE) ×8 IMPLANT
SUT SILK 2 0 FS (SUTURE) ×2 IMPLANT
SUT VIC AB 3-0 FS2 27 (SUTURE) ×4 IMPLANT
SUT VIC AB 3-0 SH 18 (SUTURE) ×2 IMPLANT
SUT VIC AB 3-0 SH 8-18 (SUTURE) ×6 IMPLANT
SYR 50ML SLIP (SYRINGE) IMPLANT
SYR BULB IRRIGATION 50ML (SYRINGE) ×6 IMPLANT
SYR CONTROL 10ML LL (SYRINGE) ×4 IMPLANT
TAPE CLOTH SURG 6X10 WHT LF (GAUZE/BANDAGES/DRESSINGS) ×2 IMPLANT
TOWEL OR 17X24 6PK STRL BLUE (TOWEL DISPOSABLE) ×4 IMPLANT
TOWEL OR 17X26 10 PK STRL BLUE (TOWEL DISPOSABLE) ×6 IMPLANT
TRAY FOLEY CATH 14FRSI W/METER (CATHETERS) ×2 IMPLANT
TUBE CONNECTING 12X1/4 (SUCTIONS) ×6 IMPLANT
WATER STERILE IRR 1000ML POUR (IV SOLUTION) IMPLANT
YANKAUER SUCT BULB TIP NO VENT (SUCTIONS) ×2 IMPLANT

## 2013-01-28 NOTE — Anesthesia Procedure Notes (Signed)
Procedure Name: Intubation Date/Time: 01/28/2013 7:36 AM Performed by: Jefm Miles E Pre-anesthesia Checklist: Patient identified, Timeout performed, Emergency Drugs available, Suction available and Patient being monitored Patient Re-evaluated:Patient Re-evaluated prior to inductionOxygen Delivery Method: Circle system utilized Preoxygenation: Pre-oxygenation with 100% oxygen Intubation Type: IV induction Ventilation: Mask ventilation without difficulty Laryngoscope Size: Mac and 3 Grade View: Grade I Tube type: Oral Tube size: 7.0 mm Number of attempts: 1 Airway Equipment and Method: Stylet Placement Confirmation: ETT inserted through vocal cords under direct vision,  breath sounds checked- equal and bilateral and positive ETCO2 Secured at: 21 cm Tube secured with: Tape Dental Injury: Teeth and Oropharynx as per pre-operative assessment

## 2013-01-28 NOTE — Progress Notes (Signed)
No pca used in pacu  

## 2013-01-28 NOTE — Anesthesia Postprocedure Evaluation (Signed)
  Anesthesia Post-op Note  Patient: Caroline Mejia  Procedure(s) Performed: Procedure(s): LEFT TOTAL MASTECTOMY WITH SENTINEL LYMPH NODE BIOPSY (Left) LEFT LATISSIMUS FLAP TO BREAST WITH SALINE IMPLANT (Left)  Patient Location: PACU  Anesthesia Type:General  Level of Consciousness: awake, oriented and patient cooperative  Airway and Oxygen Therapy: Patient Spontanous Breathing  Post-op Pain: mild  Post-op Assessment: Post-op Vital signs reviewed, Patient's Cardiovascular Status Stable, Respiratory Function Stable, Patent Airway, No signs of Nausea or vomiting and Pain level controlled  Post-op Vital Signs: stable  Complications: No apparent anesthesia complications

## 2013-01-28 NOTE — OR Nursing (Signed)
Breast weight - 777 grams.

## 2013-01-28 NOTE — Preoperative (Signed)
Beta Blockers   Reason not to administer Beta Blockers:Not Applicable 

## 2013-01-28 NOTE — Transfer of Care (Signed)
Immediate Anesthesia Transfer of Care Note  Patient: Caroline Mejia  Procedure(s) Performed: Procedure(s): LEFT TOTAL MASTECTOMY WITH SENTINEL LYMPH NODE BIOPSY (Left) LEFT LATISSIMUS FLAP TO BREAST WITH SALINE IMPLANT (Left)  Patient Location: PACU  Anesthesia Type:General  Level of Consciousness: awake, alert  and oriented  Airway & Oxygen Therapy: Patient Spontanous Breathing and Patient connected to nasal cannula oxygen  Post-op Assessment: Report given to PACU RN  Post vital signs: Reviewed and stable  Complications: No apparent anesthesia complications

## 2013-01-28 NOTE — Anesthesia Preprocedure Evaluation (Addendum)
Anesthesia Evaluation  Patient identified by MRN, date of birth, ID band Patient awake    Reviewed: Allergy & Precautions, H&P , NPO status , Patient's Chart, lab work & pertinent test results  Airway Mallampati: I TM Distance: >3 FB Neck ROM: full    Dental  (+) Teeth Intact and Dental Advisory Given   Pulmonary          Cardiovascular Rhythm:regular Rate:Normal     Neuro/Psych    GI/Hepatic GERD-  Medicated and Controlled,  Endo/Other    Renal/GU      Musculoskeletal   Abdominal   Peds  Hematology   Anesthesia Other Findings   Reproductive/Obstetrics                          Anesthesia Physical Anesthesia Plan  ASA: I  Anesthesia Plan: General   Post-op Pain Management:    Induction: Intravenous  Airway Management Planned: Oral ETT  Additional Equipment:   Intra-op Plan:   Post-operative Plan: Extubation in OR  Informed Consent: I have reviewed the patients History and Physical, chart, labs and discussed the procedure including the risks, benefits and alternatives for the proposed anesthesia with the patient or authorized representative who has indicated his/her understanding and acceptance.     Plan Discussed with: CRNA, Anesthesiologist and Surgeon  Anesthesia Plan Comments:         Anesthesia Quick Evaluation

## 2013-01-28 NOTE — Brief Op Note (Signed)
01/28/2013  2:24 PM  PATIENT:  Caroline Mejia  57 y.o. female  PRE-OPERATIVE DIAGNOSIS:  Left breast cancer  POST-OPERATIVE DIAGNOSIS:  Left breast cancer  PROCEDURE:  Procedure(s): LEFT TOTAL MASTECTOMY WITH SENTINEL LYMPH NODE BIOPSY (Left) LEFT LATISSIMUS FLAP TO BREAST WITH SALINE IMPLANT (Left)  SURGEON:  Surgeon(s) and Role: Panel 1:    * Currie Paris, MD - Assisting    * Ernestene Mention, MD - Primary  Panel 2:    * Etter Sjogren, MD - Primary  PHYSICIAN ASSISTANT:   ASSISTANTS: none   ANESTHESIA:   general  EBL:  Total I/O In: 2100 [I.V.:2100] Out: 695 [Urine:545; Blood:150]  BLOOD ADMINISTERED:none  DRAINS: (3) Jackson-Pratt drain(s) with closed bulb suction in the left back (2) and left chest (1)   LOCAL MEDICATIONS USED:  NONE  SPECIMEN:  No Specimen  DISPOSITION OF SPECIMEN:  N/A  COUNTS:  YES  TOURNIQUET:  * No tourniquets in log *  DICTATION: .Other Dictation: Dictation Number [id number]  PLAN OF CARE: Admit to inpatient   PATIENT DISPOSITION:  PACU - hemodynamically stable.   Delay start of Pharmacological VTE agent (>24hrs) due to surgical blood loss or risk of bleeding: no. She had heparin subcu pre-op.

## 2013-01-28 NOTE — Interval H&P Note (Signed)
History and Physical Interval Note:  01/28/2013 6:59 AM  Caroline Mejia  has presented today for surgery, with the diagnosis of Left breast cancer  The goals and the various methods of treatment have been discussed with the patient and family. After consideration of risks, benefits and other options for treatment, the patient has consented to  Procedure(s): LEFT TOTAL MASTECTOMY WITH SENTINEL LYMPH NODE BIOPSY (Left) LEFT LATISSIMUS FLAP TO BREAST WITH SALINE IMPLANT (Left) as a surgical intervention .  The patient's history has been reviewed, patient examined, no change in status, stable for surgery.  I have reviewed the patient's chart and labs.  Questions were answered to the patient's satisfaction.     Angelia Mould. Derrell Lolling, M.D., [facility] General and Minimally invasive Surgery Breast and Colorectal Surgery Office:   [phone] Pager:   [phone]

## 2013-01-28 NOTE — Consult Note (Signed)
Patient is here for left latissimus flap with saline implant immediate left breast reconstruction. See office note in paper chart dated 01/13/2013.

## 2013-01-28 NOTE — Op Note (Signed)
Patient Name:           Caroline Mejia   Date of Surgery:        01/28/2013  Pre op Diagnosis:      Invasive mammary carcinoma left breast, upper outer quadrant , receptor positive, HER-2-negative, clinical stage T1b, N0.  Post op Diagnosis:    Same  Procedure:                 Inject blue dye left breast, left total mastectomy, left axillary sentinel node biopsy  Surgeon:                     Angelia Mould. Derrell Lolling, M.D., FACS  Assistant:                      Cyndia Bent, M.D., FACS  Operative Indications:   Caroline Mejia is a 57 y.o. female. She was referred back to me by Dr. Ike Bene at the breast center for a newly diagnosed cancer in the left breast, upper outer quadrant. Her primary care physician is Dr. Adelene Amas. Dr. [name] was her oncologist. Dr. Etter Sjogren is her plastic surgeon.  She has a significant history of right partial mastectomy and node dissection in 2006 by Dr. Maple Hudson, followed by chemotherapy and radiation therapy. She had in breast recurrence and had right total mastectomy by Dr. Francina Ames. She then underwent stem cell Transplant and chemotherapy. She had delayed TRAM on the right and a reduction on the left. I removed her Port-A-Cath in 2009. She was last seen by me in followup on 04/22/2010 at which time she was doing fine.  She has no complaints about her breast, but recent mammograms showed a 9 mm mass in the left breast at the 1:00 position, 10 cm from the nipple. Image guided biopsy shows invasive mammary carcinoma and calcifications, invasive ductal cancer is favored. ER 100%, PR 100%t, Ki-67 8%, HER-2-negative.   Family history is negative for breast cancer but reveals ovarian cancer and a maternal grandmother and a maternal aunt.  The patient states that she had genetic testing [place] [address] and it was negative.  She is essentially healthy.   Operative Findings:       There was moderate scarring from her previous breast reduction, but no other abdomen  out to within the breast. I found 2 sentinel lymph nodes, and frozen section diagnosis was negative for metastatic cancer.  Procedure in Detail:          The patient was brought to the operating room. Gen. Anesthesia was induced. Surgical time out was performed. Following alcohol prep I injected 5 cc of blue dye into the left breast subareolar area. This was 2 cc of methylene blue mixed with 3 cc of saline. The breast was massaged for 5 minutes. Intravenous antibiotics were given. A Foley catheter was inserted. The left breast and chest wall and axilla were prepped and draped in a sterile fashion. Another surgical time out was performed. I carefully marked a transverse elliptical incision, trying to make a mirror image incision to the TRAM flap on the right. This elliptical incision was made. Skin flaps were raised superiorly to the infraclavicular area, medially to the parasternal area, inferiorly to the anterior rectus sheath and laterally to latissimus dorsi muscle. Using the neoprobe I dissected up into the axilla and found 2 sentinel nodes. Both were very hot. One was very blue. Frozen section diagnosis was negative for metastatic  cancer. The breast was dissected off of the pectoralis major and minor muscles taking the fascia with it. The lateral skin margin was marked with a silk suture and the breast sent as a separate specimen. The wound was packed with gauze after achieving good hemostasis. This was antibiotics soaked gauze and the skin was stapled shut with staples. A clear adherent dressing was placed.  At this point we had lost about 50 cc of blood. Counts were correct. No complications. At this point Dr. Odis Luster scrubbed in I scrubbed out. He will reposition the patient and dictate the reconstruction separately.      Angelia Mould. Derrell Lolling, M.D., FACS General and Minimally Invasive Surgery Breast and Colorectal Surgery  01/28/2013 9:38 AM

## 2013-01-29 LAB — CBC
HCT: 36.3 % (ref 36.0–46.0)
MCH: 29.3 pg (ref 26.0–34.0)
MCV: 84.4 fL (ref 78.0–100.0)
RBC: 4.3 MIL/uL (ref 3.87–5.11)
RDW: 13.9 % (ref 11.5–15.5)
WBC: 8.3 10*3/uL (ref 4.0–10.5)

## 2013-01-29 MED ORDER — HEPARIN SODIUM (PORCINE) 5000 UNIT/ML IJ SOLN
5000.0000 [IU] | Freq: Three times a day (TID) | INTRAMUSCULAR | Status: DC
  Administered 2013-01-29 – 2013-01-30 (×4): 5000 [IU] via SUBCUTANEOUS
  Filled 2013-01-29 (×7): qty 1

## 2013-01-29 MED ORDER — HYDROMORPHONE HCL 2 MG PO TABS
2.0000 mg | ORAL_TABLET | ORAL | Status: DC | PRN
  Administered 2013-01-29 – 2013-01-30 (×3): 4 mg via ORAL
  Administered 2013-01-30 (×3): 2 mg via ORAL
  Filled 2013-01-29 (×2): qty 1
  Filled 2013-01-29 (×3): qty 2
  Filled 2013-01-29: qty 1

## 2013-01-29 NOTE — Op Note (Deleted)
NAME:  Caroline Mejia, Caroline Mejia           ACCOUNT NO.:  [id number]  MEDICAL RECORD NO.:  [id number]  LOCATION:  [facility]                          FACILITY:  [facility]  PHYSICIAN:  Adisson Deak, M.D.     DATE OF BIRTH:  02/08/1956  DATE OF PROCEDURE:  01/28/2013 DATE OF DISCHARGE:  01/30/2013                              OPERATIVE REPORT   PREOPERATIVE DIAGNOSIS:  Left breast cancer.  POSTOPERATIVE DIAGNOSIS:  Left breast cancer.  PROCEDURES PERFORMED: 1. Left latissimus myocutaneous flap. 2. Left immediate breast reconstruction with saline implant.  SURGEON:  Theressa Piedra, M.D.  ANESTHESIA:  General.  ESTIMATED BLOOD LOSS:  70 mL.  DRAINS:  Two 19-French in the back, one 19-French in the front chest.  CLINICAL NOTE:  A 57-year-old woman who has had breast cancer on the right side with mastectomy and reconstruction with a TRAM flap, now presents with breast cancer on the left side and has selected latissimus flap with implant.  She understands the nature of the procedure and the risks and that there will be asymmetry.  She selected a saline implant rather than silicone gel, and she understood the risks to include, but not limited to, bleeding, infection, healing problems, scarring, loss of sensation, fluid accumulations, anesthesia complications, failure device, capsular contracture, displacement of device, wrinkles, ripples, asymmetry, contour deformities especially at the periphery of the reconstruction, loss of skin, loss of tissue, loss of the flap, chronic pain, loss of range of motion or strength in the left arm and left shoulder, and overall disappointment, and she understood all of these and wished to proceed.  DESCRIPTION OF PROCEDURE:  The patient had been marked for latissimus flap in the holding area.  She had been taken to the operating room and General Surgery had completed the mastectomy.  The wound had been stapled closed and covered with sterile Tegaderm and she was then  placed in a right lateral decubitus position with an axillary roll and was prepped with ChloraPrep and draped with sterile drapes including impervious drapes.  The skin paddle was incised.  The dissection was carried down through the subcutaneous tissue beveling superior and inferior in order to ensure a broader attachment of the skin paddle, paddle at the level of the muscle and the skin paddle itself in order to ensure blood supply to the skin paddle.  The muscle was identified and then the dissection continued exposing the medial, lateral superior, and inferior borders.  The muscle was then released inferiorly and then reflected in a cephalad direction and perforating vessels were either triple ligated with Ligaclips and divided or suture ligated with 3-0 Vicryl suture and divided.  The flap having been mobilized.  The skin paddle was checked, excellent color and bright red bleeding along its edges, consistent viability.  Subcutaneous tunnel was made to the chest wound and the antibiotic soaked lap that had been left there was then removed and the flap gently passed through this tunnel and placed into the left chest.  The donor defect was irrigated thoroughly with saline and meticulous hemostasis was achieved using electrocautery.  Two 19- French drains were positioned and brought through separate stab wounds infero-anteriorly and secured with 3-0 Prolene sutures.  The skin   was then advanced and closed with 0 PDS interrupted inverted deep sutures, 2- 0 Monocryl interrupted inverted deep dermal sutures and a running 3-0 Monocryl subcuticular suture.  Dermabond was applied.  Biopatches with Tegaderm to the drains and ABDs and dressing taped lightly and then she was placed supine.  The large Tegaderm that had been placed by General Surgery was then removed.  The patient was prepped with Betadine and draped with sterile drapes.  Skin staples were removed.  The flap was inspected,  excellent color.  The space was thoroughly irrigated with saline.  This was the mastectomy space.  Hemostasis with electrocautery.  Excellent hemostasis having been insured.  The flap insetting was begun first laterally and then inferiorly and then medially using 3-0 Vicryl sutures, horizontal mattress, closing this space down to dimensions just a little bit larger than the diameter of the implant.  The lateral aspect was sealed off with the same 3-0 Vicryl sutures to avoid migration of the implant.  A Sizer was then soaked in antibiotic solution.  It was positioned, it was filled with 25 mL and an additional 50 mL added and it was felt that the larger of the implants selected 575 would be appropriate.  This was compared to the opposite breast and indeed it felt like this was a better volume match to go with the 575 mL maximum fill implant.  The Sizer was removed.  The space was again irrigated with antibiotic solution and the implant was brought onto the field.  It was covered with antibiotic solution, and after thoroughly cleaning gloves, the implant was prepared.  100 mL sterile saline placed using the closed filling system and then the implant positioned and care was taken to make sure that it was properly oriented and flat without any wrinkles and it was filled with maximum 575 mL using sterile saline via a closed filling system.  The fill tubing was then removed and the remainder of the insetting of the muscle superiorly with 3-0 Vicryl interrupted sutures with great care taken to avoid damage to underlying implant, which was kept under direct vision at all times.  A 19-French drain was positioned, brought out through separate stab wound inferiorly and this was left both inferior medial and superior and then around to the lateral aspect.  It was secured with a 3-0 Prolene suture.  The skin paddle had excellent color and bright red bleeding along the periphery consistent viability.   The skin paddle was then inset with 3-0 Monocryl interrupted inverted deep dermal sutures and an occasional 3-0 Monocryl running subcuticular suture and places as needed.  Dermabond was applied, and dry sterile dressings and the chest vest was placed.  She was transferred to the recovery room in stable having tolerated the procedure well.     Jimmi Sidener, M.D.     DB/MEDQ  D:  01/28/2013  T:  01/29/2013  Job:  [id number] 

## 2013-01-29 NOTE — Progress Notes (Addendum)
1 Day Post-Op  Subjective: Stable and alert. Pain well-controlled. No nausea. No respiratory problems.  All drains functioning well. Drainage is very thin serosanguineous.  Objective: Vital signs in last 24 hours: Temp:  [97.4 F (36.3 C)-97.9 F (36.6 C)] 97.9 F (36.6 C) (04/23 0523) Pulse Rate:  [88-112] 102 (04/23 0523) Resp:  [12-28] 16 (04/23 0523) BP: (98-139)/(59-78) 98/63 mmHg (04/23 0523) SpO2:  [91 %-99 %] 95 % (04/23 0523) FiO2 (%):  [40 %] 40 % (04/22 2000) Weight:  [149 lb 0.5 oz (67.6 kg)] 149 lb 0.5 oz (67.6 kg) (04/22 1613) Last BM Date: 01/27/13  Intake/Output from previous day: 04/22 0701 - 04/23 0700 In: 3846 [P.O.:120; I.V.:3676; IV Piggyback:50] Out: 2685 [Urine:2220; Drains:315; Blood:150] Intake/Output this shift: Total I/O In: 1246 [I.V.:1246] Out: 1670 [Urine:1500; Drains:170]  General appearance: alert. Cooperative. No distress. Vital signs normal. Resp: lungs clear bilaterally. Left mastectomy skin flaps looked fine. Latissimus flap pink and viable. Drainage thin, serosanguineous.   Lab Results:  No results found for this or any previous visit (from the past 24 hour(s)).   Studies/Results: @RISRSLT24 @  .  ceFAZolin (ANCEF) IV  1 g Intravenous Q8H  . docusate sodium  100 mg Oral Daily  . heparin subcutaneous  5,000 Units Subcutaneous Q8H  . HYDROmorphone PCA 0.3 mg/mL   Intravenous Q4H  . methocarbamol  500 mg Oral TID  . pantoprazole  40 mg Oral Daily  . simvastatin  40 mg Oral QHS     Assessment/Plan: s/p Procedure(s): LEFT TOTAL MASTECTOMY WITH SENTINEL LYMPH NODE BIOPSY LEFT LATISSIMUS FLAP TO BREAST WITH SALINE IMPLANT  POD #1. Left total mastectomy, left axillary sentinel node biopsy, left latissimus flap reconstruction with saline implant. Stable. No signs of bleeding or wound complication. Start DVT prophylaxis with subcutaneous heparin today. Discontinue Foley Mobilize as much as allowed by Dr. Odis Luster. Pathology report  should be out tomorrow afternoon or Friday morning at the latest. Check CBC.    @PROBHOSP @  LOS: 1 day    Cindy Fullman M. Derrell Lolling, M.D., [facility] General and Minimally invasive Surgery Breast and Colorectal Surgery Office:   [phone] Pager:   [phone]  01/29/2013  . .prob

## 2013-01-29 NOTE — Progress Notes (Signed)
Subjective: Excellent pain control. No nausea. Tolerating fluids well.  Objective: Vital signs in last 24 hours: Temp:  [97.4 F (36.3 C)-97.9 F (36.6 C)] 97.9 F (36.6 C) (04/23 0523) Pulse Rate:  [88-112] 102 (04/23 0523) Resp:  [12-28] 16 (04/23 0523) BP: (98-139)/(59-78) 98/63 mmHg (04/23 0523) SpO2:  [91 %-99 %] 95 % (04/23 0523) FiO2 (%):  [40 %] 40 % (04/22 2000) Weight:  [149 lb 0.5 oz (67.6 kg)] 149 lb 0.5 oz (67.6 kg) (04/22 1613)  Intake/Output from previous day: 04/22 0701 - 04/23 0700 In: 3846 [P.O.:120; I.V.:3676; IV Piggyback:50] Out: 2685 [Urine:2220; Drains:315; Blood:150] Intake/Output this shift:    Operative sites: Mastectomy flaps viable. There is slight delay in capillary refill medial aspect of inferior mastectomy flap superior to the old inframammary crease incision. No evidence of epidermolysis at this point. Flap looks good. Good color throughout. Drains functioning. Drainage thin. No evidence of bleeding or infection at either site.  No results found for this basename: WBC, HGB, HCT, PLATELETS, NA, K, CL, CO2, BUN, CREATININE, GLU,  in the last 72 hours  Studies/Results: Nm Sentinel Node Inj-no Rpt (breast)  01/28/2013  CLINICAL DATA: Cancer left breast   Sulfur colloid was injected intradermally by the nuclear medicine  technologist for breast cancer sentinel node localization.      Assessment/Plan: D/C foley (done). Advance diet. Saline lock IV. PO pain med. D/C PCA dilaudid. Ambulate in hall. DVT prophylaxis ordered by Dr. Derrell Lolling already.   LOS: 1 day    Etter Sjogren M 01/29/2013 7:32 AM

## 2013-01-29 NOTE — Op Note (Signed)
NAME:  Caroline, Mejia NO.:  [id number]  MEDICAL RECORD NO.:  [id number]  LOCATION:  [facility]                          FACILITY:  [facility]  PHYSICIAN:  Etter Sjogren, M.D.     DATE OF BIRTH:  May 28, 1956  DATE OF PROCEDURE:  01/28/2013 DATE OF DISCHARGE:  01/30/2013                              OPERATIVE REPORT   PREOPERATIVE DIAGNOSIS:  Left breast cancer.  POSTOPERATIVE DIAGNOSIS:  Left breast cancer.  PROCEDURES PERFORMED: 1. Left latissimus myocutaneous flap. 2. Left immediate breast reconstruction with saline implant.  SURGEON:  Etter Sjogren, M.D.  ANESTHESIA:  General.  ESTIMATED BLOOD LOSS:  70 mL.  DRAINS:  Two 19-French in the back, one 19-French in the front chest.  CLINICAL NOTE:  A 57 year old woman who has had breast cancer on the right side with mastectomy and reconstruction with a TRAM flap, now presents with breast cancer on the left side and has selected latissimus flap with implant.  She understands the nature of the procedure and the risks and that there will be asymmetry.  She selected a saline implant rather than silicone gel, and she understood the risks to include, but not limited to, bleeding, infection, healing problems, scarring, loss of sensation, fluid accumulations, anesthesia complications, failure device, capsular contracture, displacement of device, wrinkles, ripples, asymmetry, contour deformities especially at the periphery of the reconstruction, loss of skin, loss of tissue, loss of the flap, chronic pain, loss of range of motion or strength in the left arm and left shoulder, and overall disappointment, and she understood all of these and wished to proceed.  DESCRIPTION OF PROCEDURE:  The patient had been marked for latissimus flap in the holding area.  She had been taken to the operating room and General Surgery had completed the mastectomy.  The wound had been stapled closed and covered with sterile Tegaderm and she was then  placed in a right lateral decubitus position with an axillary roll and was prepped with ChloraPrep and draped with sterile drapes including impervious drapes.  The skin paddle was incised.  The dissection was carried down through the subcutaneous tissue beveling superior and inferior in order to ensure a broader attachment of the skin paddle, paddle at the level of the muscle and the skin paddle itself in order to ensure blood supply to the skin paddle.  The muscle was identified and then the dissection continued exposing the medial, lateral superior, and inferior borders.  The muscle was then released inferiorly and then reflected in a cephalad direction and perforating vessels were either triple ligated with Ligaclips and divided or suture ligated with 3-0 Vicryl suture and divided.  The flap having been mobilized.  The skin paddle was checked, excellent color and bright red bleeding along its edges, consistent viability.  Subcutaneous tunnel was made to the chest wound and the antibiotic soaked lap that had been left there was then removed and the flap gently passed through this tunnel and placed into the left chest.  The donor defect was irrigated thoroughly with saline and meticulous hemostasis was achieved using electrocautery.  Two 19- French drains were positioned and brought through separate stab wounds infero-anteriorly and secured with 3-0 Prolene sutures.  The skin  was then advanced and closed with 0 PDS interrupted inverted deep sutures, 2- 0 Monocryl interrupted inverted deep dermal sutures and a running 3-0 Monocryl subcuticular suture.  Dermabond was applied.  Biopatches with Tegaderm to the drains and ABDs and dressing taped lightly and then she was placed supine.  The large Tegaderm that had been placed by General Surgery was then removed.  The patient was prepped with Betadine and draped with sterile drapes.  Skin staples were removed.  The flap was inspected,  excellent color.  The space was thoroughly irrigated with saline.  This was the mastectomy space.  Hemostasis with electrocautery.  Excellent hemostasis having been insured.  The flap insetting was begun first laterally and then inferiorly and then medially using 3-0 Vicryl sutures, horizontal mattress, closing this space down to dimensions just a little bit larger than the diameter of the implant.  The lateral aspect was sealed off with the same 3-0 Vicryl sutures to avoid migration of the implant.  A Sizer was then soaked in antibiotic solution.  It was positioned, it was filled with 25 mL and an additional 50 mL added and it was felt that the larger of the implants selected 575 would be appropriate.  This was compared to the opposite breast and indeed it felt like this was a better volume match to go with the 575 mL maximum fill implant.  The Sizer was removed.  The space was again irrigated with antibiotic solution and the implant was brought onto the field.  It was covered with antibiotic solution, and after thoroughly cleaning gloves, the implant was prepared.  100 mL sterile saline placed using the closed filling system and then the implant positioned and care was taken to make sure that it was properly oriented and flat without any wrinkles and it was filled with maximum 575 mL using sterile saline via a closed filling system.  The fill tubing was then removed and the remainder of the insetting of the muscle superiorly with 3-0 Vicryl interrupted sutures with great care taken to avoid damage to underlying implant, which was kept under direct vision at all times.  A 19-French drain was positioned, brought out through separate stab wound inferiorly and this was left both inferior medial and superior and then around to the lateral aspect.  It was secured with a 3-0 Prolene suture.  The skin paddle had excellent color and bright red bleeding along the periphery consistent viability.   The skin paddle was then inset with 3-0 Monocryl interrupted inverted deep dermal sutures and an occasional 3-0 Monocryl running subcuticular suture and places as needed.  Dermabond was applied, and dry sterile dressings and the chest vest was placed.  She was transferred to the recovery room in stable having tolerated the procedure well.     Etter Sjogren, M.D.     DB/MEDQ  D:  01/28/2013  T:  01/29/2013  Job:  [id number]

## 2013-01-30 MED ORDER — DSS 100 MG PO CAPS: 100.0000 mg | ORAL_CAPSULE | Freq: Every day | ORAL | Status: DC

## 2013-01-30 MED ORDER — METHOCARBAMOL 500 MG PO TABS: 500.0000 mg | ORAL_TABLET | Freq: Three times a day (TID) | ORAL | Status: DC

## 2013-01-30 MED ORDER — HYDROMORPHONE HCL 2 MG PO TABS: 2.0000 mg | ORAL_TABLET | ORAL | Status: DC | PRN

## 2013-01-30 NOTE — Discharge Summary (Signed)
Physician Discharge Summary  Patient ID: STEELE LEDONNE MRN: [id number] DOB/AGE: Jan 21, 1956 57 y.o.  Admit date: 01/28/2013 Discharge date: 01/30/2013  Admission Diagnoses:Left breast cancer  Discharge Diagnoses: Same Active Problems:   Breast CA   Discharged Condition: good  Hospital Course: On the day of admission the patient was taken to surgery and had left mastectomy and sentinel node and left latissimus flap with immediate reconstruction saline implant. The patient tolerated the procedures well. Postoperatively, the flap maintained excellent color and capillary refill. The patient was ambulatory and tolerating diet on the first postoperative day. DVT prophylaxis with heparin both pre-op and post-op. Ready for discharge.  Treatments: antibiotics: Ancef, anticoagulation: heparin and surgery: left mastectomy, sentinel node, left latissimus flap, reconstruction with saline implant.  Discharge Exam: Blood pressure 124/83, pulse 81, temperature 98.2 F (36.8 C), temperature source Oral, resp. rate 17, height 5\' 2"  (1.575 m), weight 149 lb 0.5 oz (67.6 kg), SpO2 91.00%.  Operative sites: Mastectomy flaps viable. There is thin strip of epidermolysis at edge of the inferior mastectomy flap. Does not appear to be full thickness at present. Flap viable. Excellent color throughout the skin paddle. Drains functioning. Drainage thin. There is no evidence of bleeding or infection left chest or left back.  Disposition: Discharge home. Return to office next week.     Medication List    STOP taking these medications       fish oil-omega-3 fatty acids 1000 MG capsule      TAKE these medications       calcium carbonate 600 MG Tabs  Commonly known as:  OS-CAL  Take 600 mg by mouth 2 (two) times daily with a meal.     DSS 100 MG Caps  Take 100 mg by mouth daily.     HYDROmorphone 2 MG tablet  Commonly known as:  DILAUDID  Take 1-2 tablets (2-4 mg total) by mouth every 4 (four)  hours as needed.     methocarbamol 500 MG tablet  Commonly known as:  ROBAXIN  Take 1 tablet (500 mg total) by mouth 3 (three) times daily.     omeprazole 20 MG capsule  Commonly known as:  PRILOSEC  Take 20 mg by mouth daily.     simvastatin 40 MG tablet  Commonly known as:  ZOCOR  Take 40 mg by mouth at bedtime.         SignedOdis Luster, Georganne Siple M 01/30/2013, 2:10 PM

## 2013-01-30 NOTE — Progress Notes (Signed)
2 Days Post-Op  Subjective: Doing fairly well. Pain seems well-controlled with oral medications. Tolerating diet. Angulatory. Able to sleep at night.  Pathology report is pending at this hour.  All drains are functioning with thin serosanguineous drainage.Hemoglobin 12.6 yesterday.  Objective: Vital signs in last 24 hours: Temp:  [98.2 F (36.8 C)-98.7 F (37.1 C)] 98.2 F (36.8 C) (04/24 0535) Pulse Rate:  [95-105] 95 (04/24 0535) Resp:  [16-18] 18 (04/24 0535) BP: (120-136)/(72-81) 126/81 mmHg (04/24 0535) SpO2:  [85 %-96 %] 96 % (04/24 0535) Last BM Date: 01/27/13  Intake/Output from previous day: 04/23 0701 - 04/24 0700 In: 840 [P.O.:840] Out: 1520 [Urine:1100; Drains:420] Intake/Output this shift: Total I/O In: -  Out: 1360 [Urine:1100; Drains:260]  General appearance: alert. Mental status normal. In no distress. Chest wall: no tenderness, left latissimus flap pink, warm, viable, soft. Thin rim of discoloration lower mastectomy flap , above reduction mammoplasty incision. This may or may not go on  to epidermal lysis. JP drainage thin, serosanguineous.  Lab Results:  Results for orders placed during the hospital encounter of 01/28/13 (from the past 24 hour(s))  CBC     Status: None   Collection Time    01/29/13  7:00 AM      Result Value Range   WBC 8.3  4.0 - 10.5 K/uL   RBC 4.30  3.87 - 5.11 MIL/uL   Hemoglobin 12.6  12.0 - 15.0 g/dL   HCT 16.1  09.6 - 04.5 %   MCV 84.4  78.0 - 100.0 fL   MCH 29.3  26.0 - 34.0 pg   MCHC 34.7  30.0 - 36.0 g/dL   RDW 40.9  81.1 - 91.4 %   Platelets    150 - 400 K/uL   Value: PLATELET CLUMPS NOTED ON SMEAR, COUNT APPEARS ADEQUATE     Studies/Results: @RISRSLT24 @  .  ceFAZolin (ANCEF) IV  1 g Intravenous Q8H  . docusate sodium  100 mg Oral Daily  . heparin subcutaneous  5,000 Units Subcutaneous Q8H  . methocarbamol  500 mg Oral TID  . pantoprazole  40 mg Oral Daily  . simvastatin  40 mg Oral QHS      Assessment/Plan: s/p Procedure(s): LEFT TOTAL MASTECTOMY WITH SENTINEL LYMPH NODE BIOPSY LEFT LATISSIMUS FLAP TO BREAST WITH SALINE IMPLANT  POD #2. Left total mastectomy, left axillary sentinel node biopsy, left latissimus flap reconstruction with saline implant. Stable. No signs of bleeding or infection. Mobilize as much as allowed by Dr. Odis Luster. Wound management and discharge planning per Dr. Odis Luster  We discussed referral to a medical oncologist, and I will make that referral as an outpatient. She needs to be reassigned since her previous medical oncologist has moved out of town.  I will discuss pathology report with her once it is available, hopefully this afternoon or tomorrow.  Once discharge, I will see her in 3 weeks, anticipating frequent outpatient visits with Dr. Odis Luster.  @PROBHOSP @  LOS: 2 days    Mylani Gentry M. Derrell Lolling, M.D., [facility] General and Minimally invasive Surgery Breast and Colorectal Surgery Office:   [phone] Pager:   [phone]  01/30/2013  . .prob

## 2013-01-30 NOTE — Progress Notes (Signed)
Discharge patient, home discharge instruction given, no qusetions verbalized

## 2013-01-31 ENCOUNTER — Other Ambulatory Visit: Payer: Self-pay | Admitting: Plastic Surgery

## 2013-01-31 ENCOUNTER — Telehealth (INDEPENDENT_AMBULATORY_CARE_PROVIDER_SITE_OTHER): Payer: Self-pay | Admitting: General Surgery

## 2013-01-31 ENCOUNTER — Other Ambulatory Visit (INDEPENDENT_AMBULATORY_CARE_PROVIDER_SITE_OTHER): Payer: Self-pay

## 2013-01-31 ENCOUNTER — Telehealth (INDEPENDENT_AMBULATORY_CARE_PROVIDER_SITE_OTHER): Payer: Self-pay

## 2013-01-31 DIAGNOSIS — C50912 Malignant neoplasm of unspecified site of left female breast: Secondary | ICD-10-CM

## 2013-01-31 NOTE — Telephone Encounter (Signed)
I called and gave the pt her postop appointment.

## 2013-01-31 NOTE — Telephone Encounter (Signed)
I discussed the patient's pathology report with her by phone this afternoon. Sentinel nodes are negative. Tumor 1.4 cm. Pathologic stage T1c, N0, receptor positive, HER-2-negative.   Angelia Mould. Derrell Lolling, M.D., [facility] General and Minimally invasive Surgery Breast and Colorectal Surgery Office:   [phone] Pager:   [phone]

## 2013-02-04 ENCOUNTER — Encounter (HOSPITAL_COMMUNITY): Payer: Self-pay | Admitting: General Surgery

## 2013-02-04 ENCOUNTER — Telehealth: Payer: Self-pay | Admitting: *Deleted

## 2013-02-04 NOTE — Telephone Encounter (Signed)
Left message for pt to return my call so I can schedule her w/ a Med Onc.

## 2013-02-05 ENCOUNTER — Telehealth: Payer: Self-pay | Admitting: *Deleted

## 2013-02-05 NOTE — Telephone Encounter (Signed)
Pt returned my call and I confirmed 02/06/13 appt w/ pt.  Unable to mail before letter & packet, so gave verbal and put in notes for registration to give pt a packet.  Emailed Huntley Dec at [facility] to make her aware.  Took paperwork to Med Rec for chart.

## 2013-02-06 ENCOUNTER — Other Ambulatory Visit (HOSPITAL_BASED_OUTPATIENT_CLINIC_OR_DEPARTMENT_OTHER): Payer: 59 | Admitting: Lab

## 2013-02-06 ENCOUNTER — Ambulatory Visit: Payer: 59

## 2013-02-06 ENCOUNTER — Ambulatory Visit (HOSPITAL_BASED_OUTPATIENT_CLINIC_OR_DEPARTMENT_OTHER): Payer: 59 | Admitting: Oncology

## 2013-02-06 ENCOUNTER — Encounter: Payer: Self-pay | Admitting: Oncology

## 2013-02-06 ENCOUNTER — Other Ambulatory Visit: Payer: Self-pay | Admitting: *Deleted

## 2013-02-06 VITALS — BP 121/79 | HR 93 | Temp 98.0°F | Resp 20 | Ht 62.0 in | Wt 146.4 lb

## 2013-02-06 DIAGNOSIS — C50912 Malignant neoplasm of unspecified site of left female breast: Secondary | ICD-10-CM

## 2013-02-06 DIAGNOSIS — C50919 Malignant neoplasm of unspecified site of unspecified female breast: Secondary | ICD-10-CM

## 2013-02-06 LAB — COMPREHENSIVE METABOLIC PANEL (CC13)
ALT: 25 U/L (ref 0–55)
Albumin: 3.1 g/dL — ABNORMAL LOW (ref 3.5–5.0)
CO2: 26 mEq/L (ref 22–29)
Calcium: 9.2 mg/dL (ref 8.4–10.4)
Chloride: 106 mEq/L (ref 98–107)
Creatinine: 0.8 mg/dL (ref 0.6–1.1)
Potassium: 3.7 mEq/L (ref 3.5–5.1)

## 2013-02-06 LAB — CBC WITH DIFFERENTIAL/PLATELET
BASO%: 0.9 % (ref 0.0–2.0)
Basophils Absolute: 0.1 10*3/uL (ref 0.0–0.1)
HCT: 43.8 % (ref 34.8–46.6)
HGB: 14.7 g/dL (ref 11.6–15.9)
MCHC: 33.6 g/dL (ref 31.5–36.0)
MONO#: 0.7 10*3/uL (ref 0.1–0.9)
NEUT%: 44.3 % (ref 38.4–76.8)
WBC: 7 10*3/uL (ref 3.9–10.3)
lymph#: 2.9 10*3/uL (ref 0.9–3.3)

## 2013-02-06 NOTE — Progress Notes (Signed)
Checked in new pt with no financial concerns. °

## 2013-02-06 NOTE — Progress Notes (Signed)
ID: Al Pimple   DOB: [birth year]  MR#: [id number]  YQM#:[id number]  PCP: Benita Stabile, MD GYN:  SU: Claud Kelp OTHER MD: Etter Sjogren, Molli Hazard manning   HISTORY OF PRESENT ILLNESS: Caroline Mejia was followed by my former partner, [name], for a right-sided breast cancer dating back to March of 2006. I have summarized that data below. More recently, routine screening mammography 11/25/2012 at the breast Center showed a possible mass in the left breast. Diagnostic left mammogram and ultrasonography 12/05/2012 showed a persistent density in the upper inner left breast, which was not palpable by exam. Ultrasound confirmed a vertically oriented hypoechoic mass measuring 0.9 cm. The left axilla was benign.  Biopsy of the left breast mass 12/11/2012 showed ([id number]) an invasive ductal carcinoma, low-grade, estrogen and progesterone receptors both positive at 100%, with an MIB-1 of 8%, and no HER-2 amplification by CISH. On 01/28/2013 the patient underwent left mastectomy with sentinel lymph node sampling. The final pathology ([id number]) showed an invasive ductal carcinoma, grade 2, maximal size 0.9 cm, with both sentinel lymph nodes negative, and ample margins.  The patient's subsequent history is as detailed below.  INTERVAL HISTORY: Caroline Mejia was seen at the breast clinic may want 2014 accompanied by her mother. She is establishing herself on my service today.  REVIEW OF SYSTEMS: She did well with her left mastectomy and latissimus flap reconstruction, without significant pain (it even though the drains are still in place), fever, or bleeding. She has had no unusual headaches, visual changes, nausea, vomiting, dizziness, or gait imbalance. A detailed review of systems today was otherwise entirely negative.  PAST MEDICAL HISTORY: Past Medical History  Diagnosis Date  . GERD (gastroesophageal reflux disease)   . H/O hiatal hernia   . Breast cancer     bilaterally; "have had  chemo & radiation on right side only"  (01/28/2013)    PAST SURGICAL HISTORY: Past Surgical History  Procedure Laterality Date  . Tubal ligation  1983  . Reconstruction breast w/ tram flap Right 2008  . Breast reduction surgery Left 2009  . Breast lumpectomy Right 2006  . Breast surgery    . Breast biopsy Right 2006; 2008  . Breast biopsy Left 12/2012  . Mastectomy Right 2008  . Mastectomy Left 01/28/2013  . Reconstruction breast w/ latissimus dorsi flap Left 01/28/2013  . Mastectomy w/ sentinel node biopsy Left 01/28/2013    Procedure: LEFT TOTAL MASTECTOMY WITH SENTINEL LYMPH NODE BIOPSY;  Surgeon: Ernestene Mention, MD;  Location: [facility] OR;  Service: General;  Laterality: Left;  . Latissimus flap to breast Left 01/28/2013    Procedure: LEFT LATISSIMUS FLAP TO BREAST WITH SALINE IMPLANT;  Surgeon: Etter Sjogren, MD;  Location: [facility] OR;  Service: Plastics;  Laterality: Left;    FAMILY HISTORY Family History  Problem Relation Age of Onset  . Heart disease Mother   . Heart disease Father   . Cancer Father    the patient's father died from congestive heart failure at the age of 47. The patient's mother is currently 78 years old. The patient has one brother, 2 sisters. There is no history of breast or ovarian cancer in the family.  GYNECOLOGIC HISTORY: Menarche age 53, first live birth age 1. The patient is GX P2. She went through menopause with her first chemotherapy, in early 2006. She never took hormone replacement.  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 At home she lives with her mother. They share a cat. Daughter  [name] works as a Solicitor for a [employer]. Son [name] works as a Academic librarian in [place]. The patient has 6 grandchildren. She attends [employer]   ADVANCED DIRECTIVES: Not in place but were discussed with the patient may want 2014. She states she has the papers and is intending to complete  dense.  HEALTH MAINTENANCE: History  Substance Use Topics  . Smoking status: Never Smoker   . Smokeless tobacco: Never Used  . Alcohol Use: 0.0 oz/week     Comment: 01/28/2013 "glass of wine ~ once/month"     Colonoscopy: 2008/ [facility]  PAP: May 2013  Bone density: February 2012; osteopenia (T -1.9)  Lipid panel:  No Known Allergies  Current Outpatient Prescriptions  Medication Sig Dispense Refill  . calcium carbonate (OS-CAL) 600 MG TABS Take 600 mg by mouth 2 (two) times daily with a meal.        . docusate sodium 100 MG CAPS Take 100 mg by mouth daily.  30 capsule  0  . HYDROmorphone (DILAUDID) 2 MG tablet Take 1-2 tablets (2-4 mg total) by mouth every 4 (four) hours as needed.  50 tablet  0  . methocarbamol (ROBAXIN) 500 MG tablet Take 1 tablet (500 mg total) by mouth 3 (three) times daily.  30 tablet  1  . omeprazole (PRILOSEC) 20 MG capsule Take 20 mg by mouth daily.        . simvastatin (ZOCOR) 40 MG tablet Take 40 mg by mouth at bedtime.         No current facility-administered medications for this visit.    OBJECTIVE: Middle-aged white woman in no acute distress Filed Vitals:   02/06/13 1615  BP: 121/79  Pulse: 93  Temp: 98 F (36.7 C)  Resp: 20     Body mass index is 26.77 kg/(m^2).    ECOG FS: 1  Sclerae unicteric Oropharynx clear No cervical or supraclavicular adenopathy Lungs no rales or rhonchi Heart regular rate and rhythm Abd benign MSK no focal spinal tenderness, no peripheral edema; the bandages over the latissimus flap are still in place, as are 2 drains. There is scant serous sanguinous fluid in the bulbs Neuro: nonfocal, well oriented, pleasant affect Breasts: Status post bilateral mastectomies, with TRAM flap on the right and no latissimus flap on the left. No evidence of local recurrence. Both axillae are benign.   LAB RESULTS: Lab Results  Component Value Date   WBC 7.0 02/06/2013   NEUTROABS 3.1 02/06/2013   HGB 14.7 02/06/2013   HCT 43.8  02/06/2013   MCV 86.0 02/06/2013   PLT 330 02/06/2013      Chemistry      Component Value Date/Time   NA 138 02/06/2013 1605   NA 143 01/21/2013 0829   K 3.7 02/06/2013 1605   K 4.2 01/21/2013 0829   CL 106 02/06/2013 1605   CL 106 01/21/2013 0829   CO2 26 02/06/2013 1605   CO2 29 01/21/2013 0829   BUN 8.4 02/06/2013 1605   BUN 9 01/21/2013 0829   CREATININE 0.8 02/06/2013 1605   CREATININE 0.92 01/21/2013 0829      Component Value Date/Time   CALCIUM 9.2 02/06/2013 1605   CALCIUM 9.9 01/21/2013 0829   ALKPHOS 62 02/06/2013 1605   ALKPHOS 61 01/21/2013 0829   AST 25 02/06/2013 1605   AST 25 01/21/2013 0829   ALT 25 02/06/2013 1605   ALT 19 01/21/2013 0829   BILITOT 0.50 02/06/2013 1605  BILITOT 0.9 01/21/2013 0829       Lab Results  Component Value Date   LABCA2 48* 01/21/2013    No components found with this basename: [id number]    No results found for this basename: INR,  in the last 168 hours  Urinalysis    Component Value Date/Time   COLORURINE YELLOW 10/18/2007 1635   APPEARANCEUR CLEAR 10/18/2007 1635   LABSPEC 1.010 10/18/2007 1635   PHURINE 5.5 10/18/2007 1635   GLUCOSEU NEGATIVE 10/18/2007 1635   HGBUR NEGATIVE 10/18/2007 1635   BILIRUBINUR NEGATIVE 10/18/2007 1635   KETONESUR NEGATIVE 10/18/2007 1635   PROTEINUR NEGATIVE 10/18/2007 1635   UROBILINOGEN 0.2 10/18/2007 1635   NITRITE NEGATIVE 10/18/2007 1635   LEUKOCYTESUR SMALL* 10/18/2007 1635    STUDIES: Chest 2 View  01/21/2013  *RADIOLOGY REPORT*  Clinical Data: Preoperative assessment for breast cancer surgery  CHEST - 2 VIEW  Comparison: 01/16/2007  Findings: Normal heart size and pulmonary vascularity. Minimally tortuous thoracic aorta. Moderate to large hiatal hernia. Tip of left lung apex excluded. Minimal atelectasis left lower lobe, chronic. Remaining lungs clear. No pleural effusion or pneumothorax. Bones unremarkable.  IMPRESSION: Moderate to large hiatal hernia. Minimal left lower lobe atelectasis.   Original Report Authenticated By: Ulyses Southward, M.D.    Nm Sentinel Node Inj-no Rpt (breast)  01/28/2013  CLINICAL DATA: Cancer left breast   Sulfur colloid was injected intradermally by the nuclear medicine  technologist for breast cancer sentinel node localization.      ASSESSMENT: 57 y.o. BRCA negative [place]  (1) status post right breast biopsy 12/07/2004 for a clinical T2 N0, stage IIA invasive ductal carcinoma, grade 3, initially read as triple negative  (2) treated neoadjuvant [place] with cyclophosphamide and doxorubicin in dose dense fashion x2, followed by fluorouracil, epirubicin and cyclophosphamide in dose dense fashion x2, followed by Taxotere x4, completed 04/20/2005.  (3) status post right mastectomy with sentinel lymph node sampling 05/11/2005 for a residual pT1c pN0 invasive ductal carcinoma, grade 3, again estrogen and progesterone receptor negative, with an MIB-1 of 52%, but HER-2 amplified by FISH with a ratio of 3.75  (4) adjuvant radiation completed 08/02/2005  (5) recurrence to the right breast documented February of 2008, status post right mastectomy 12/27/2006 for a pT1b NX invasive ductal carcinoma, again estrogen and progesterone receptor negative, with an MIB-1 of 64%, and HER-2 amplified by fish with a ratio of 3.58.  (6) adjuvant chemotherapy with weekly carboplatin (x7), Abraxane (x9)  and trastuzumab completed 05/08/2007. Trastuzumab was continued to May 2009, to total one year  (7) left mastectomy with sentinel lymph node sampling 01/28/2013 for a pT1c pN0, stage IA invasive ductal carcinoma, grade 1, estrogen and progesterone receptor both 100% positive, with an MIB-1 of 8%, and no HER-2 amplification  (8) status post remote TRAM flap reconstruction on the right, latissimus flap reconstruction on the left  (9) starting anastrozole May 2014  PLAN: We spent the better part of today's hour-long visit going over the old data and then reviewing the new data regarding Angline's bilateral  breast cancers. She understands the cancer in the left bears no relationship to the one on the right. We ran and adjuvant! profile, and this would quote her a risk of recurrence of 23% with local treatment only, decreasing by 12% if she took aromatase inhibitors for 5 years. Adding chemotherapy would only reduce the risk another 3%. Accordingly I do not feel it is worthwhile sending an Oncotype in this case, although that was  discussed. Both the patient and myself are quite comfortable foregoing adjuvant chemotherapy for her left sided breast cancer, particularly in light of the chemotherapy she has previously received for the one on the right  We then discussed the differences between tamoxifen and aromatase inhibitors. She has a good understanding of the possible toxicities, side effects, and complications of these drugs, and we're going to start with anastrozole. If she tolerates it well, she will have the option of switching to tamoxifen after 2 years or simply continuing to 5 years on anastrozole.  Her bone density 2 years ago showed osteopenia, and we are going to repeat it before she return to see me in August. At that visit, assuming she is indeed tolerating anastrozole well, we will give her a dose of zolendronate, and repeat that yearly while she is on aromatase inhibitors. I have asked her to make sure to check with her dentist before we start to make sure no major dental surgery will be planned.  Caroline will return to see me in August. She knows to call for any problems that may develop before that visit.   Asaf Elmquist C    02/06/2013

## 2013-02-10 ENCOUNTER — Telehealth: Payer: Self-pay | Admitting: *Deleted

## 2013-02-10 NOTE — Telephone Encounter (Signed)
sw pt gv appt for her bone density, lab, ov with GCM, and made her aware that after seeing GCM on 8/26 tx will follow. Pt is aware and im also mailing her a schedule...td

## 2013-02-17 ENCOUNTER — Other Ambulatory Visit: Payer: Self-pay | Admitting: Oncology

## 2013-02-17 NOTE — Progress Notes (Signed)
The patient's genetics results were reviewed--they were negative. Results are being scannned in the current chart.

## 2013-02-20 ENCOUNTER — Encounter (INDEPENDENT_AMBULATORY_CARE_PROVIDER_SITE_OTHER): Payer: Self-pay | Admitting: General Surgery

## 2013-02-20 ENCOUNTER — Ambulatory Visit (INDEPENDENT_AMBULATORY_CARE_PROVIDER_SITE_OTHER): Payer: 59 | Admitting: General Surgery

## 2013-02-20 VITALS — BP 110/78 | HR 72 | Temp 97.0°F | Resp 18 | Ht 62.0 in | Wt 143.0 lb

## 2013-02-20 DIAGNOSIS — C50912 Malignant neoplasm of unspecified site of left female breast: Secondary | ICD-10-CM

## 2013-02-20 DIAGNOSIS — C50919 Malignant neoplasm of unspecified site of unspecified female breast: Secondary | ICD-10-CM

## 2013-02-20 NOTE — Patient Instructions (Signed)
You seem to be recovering from your left mastectomy and left breast reconstruction without any obvious surgical complications.  Be sure to keep the range of motion of her left shoulder going, exercise frequently.  Keep your appointments with Dr. Lytle [place] will be seeing Dr. Darnelle Catalan frequently starting in September.  Return to see Dr. Derrell Lolling in November

## 2013-02-20 NOTE — Progress Notes (Signed)
Patient ID: Caroline Mejia, female   DOB: March 24, 1956, 57 y.o.   MRN: [id number] History: This patient recently underwent left total mastectomy, left axilla sentinel lobe biopsy, latissimus flap and implant based reconstruction by Dr. Odis Luster. Final pathology shows a 1.4 cm invasive ductal carcinoma and associated DCIS. Nodes were negative. Final pathology is a stage TI C., N0, receptor positive, HER-2/neu negative. Negative margins. Past past history is significant for a right partial mastectomy a similar biopsy in March 2006 for T2 N0 cancer. She developed a recurrence underwent a right mastectomy with reconstruction in March 2008. She is doing well. She has full wedge of motion of her left shoulder. Essentially no pain. She has seen Dr. Darnelle Catalan and plans to start anastrozole in August or September. He has reviewed her pathology with her.  Exam: Patient looks well. No distress. Left mastectomy skin flaps healthy. No nodules. No infection. No drainage. No necrosis. Reconstruction looks good. Tissues are soft. Symmetry was good. Back incision looks good. Range of motion left shoulder 100%.  Assessment: Invasive ductal carcinoma left breast and DCIS, receptor positive, HER-2-negative, pathologic stage T1 C. N0 Doing well in the postop period following left total mastectomy, sentinel node biopsy, and latissimus flap reconstruction with implant Remote history right breast cancer as detailed above  Plan: She will see Dr. Odis Luster in 3 weeks She will see Dr. Darnelle Catalan in August and we'll probably start antiestrogen therapy at that time Return to see me in November. She doesn't seem to have an indication for physical therapy.   Angelia Mould. Derrell Lolling, M.D., [facility] General and Minimally invasive Surgery Breast and Colorectal Surgery Office:   [phone] Pager:   [phone]

## 2013-03-05 ENCOUNTER — Other Ambulatory Visit (HOSPITAL_COMMUNITY)
Admission: RE | Admit: 2013-03-05 | Discharge: 2013-03-05 | Disposition: A | Discharge status: Home / Self Care | Payer: 59 | Source: Ambulatory Visit | Attending: Family Medicine | Admitting: Family Medicine

## 2013-03-05 ENCOUNTER — Other Ambulatory Visit: Payer: Self-pay | Admitting: Family Medicine

## 2013-03-05 DIAGNOSIS — Z124 Encounter for screening for malignant neoplasm of cervix: Secondary | ICD-10-CM | POA: Insufficient documentation

## 2013-03-10 ENCOUNTER — Encounter: Payer: Self-pay | Admitting: Oncology

## 2013-03-20 ENCOUNTER — Ambulatory Visit: Payer: 59 | Admitting: Oncology

## 2013-04-22 ENCOUNTER — Encounter: Payer: Self-pay | Admitting: *Deleted

## 2013-04-22 NOTE — Progress Notes (Signed)
Mailed after appt letter to pt. 

## 2013-04-29 ENCOUNTER — Ambulatory Visit
Admission: RE | Admit: 2013-04-29 | Discharge: 2013-04-29 | Disposition: A | Discharge status: Home / Self Care | Payer: 59 | Source: Ambulatory Visit | Attending: Oncology | Admitting: Oncology

## 2013-04-29 DIAGNOSIS — C50912 Malignant neoplasm of unspecified site of left female breast: Secondary | ICD-10-CM

## 2013-05-27 ENCOUNTER — Other Ambulatory Visit (HOSPITAL_BASED_OUTPATIENT_CLINIC_OR_DEPARTMENT_OTHER): Payer: 59 | Admitting: Lab

## 2013-05-27 DIAGNOSIS — C50419 Malignant neoplasm of upper-outer quadrant of unspecified female breast: Secondary | ICD-10-CM

## 2013-05-27 DIAGNOSIS — C50912 Malignant neoplasm of unspecified site of left female breast: Secondary | ICD-10-CM

## 2013-05-27 LAB — COMPREHENSIVE METABOLIC PANEL (CC13)
ALT: 24 U/L (ref 0–55)
AST: 28 U/L (ref 5–34)
Albumin: 3.7 g/dL (ref 3.5–5.0)
CO2: 25 mEq/L (ref 22–29)
Calcium: 9.8 mg/dL (ref 8.4–10.4)
Chloride: 107 mEq/L (ref 98–109)
Potassium: 4 mEq/L (ref 3.5–5.1)
Sodium: 143 mEq/L (ref 136–145)
Total Protein: 7.5 g/dL (ref 6.4–8.3)

## 2013-05-27 LAB — CBC WITH DIFFERENTIAL/PLATELET
BASO%: 0.9 % (ref 0.0–2.0)
HCT: 44.6 % (ref 34.8–46.6)
MCHC: 32.9 g/dL (ref 31.5–36.0)
MONO#: 0.5 10*3/uL (ref 0.1–0.9)
NEUT%: 45.6 % (ref 38.4–76.8)
RBC: 5.22 10*6/uL (ref 3.70–5.45)
RDW: 13.3 % (ref 11.2–14.5)
WBC: 6.4 10*3/uL (ref 3.9–10.3)
lymph#: 2.8 10*3/uL (ref 0.9–3.3)

## 2013-05-28 LAB — VITAMIN D 25 HYDROXY (VIT D DEFICIENCY, FRACTURES): Vit D, 25-Hydroxy: 56 ng/mL (ref 30–89)

## 2013-06-02 ENCOUNTER — Other Ambulatory Visit: Payer: Self-pay | Admitting: *Deleted

## 2013-06-03 ENCOUNTER — Encounter: Payer: Self-pay | Admitting: Physician Assistant

## 2013-06-03 ENCOUNTER — Ambulatory Visit (HOSPITAL_BASED_OUTPATIENT_CLINIC_OR_DEPARTMENT_OTHER): Payer: 59 | Admitting: Physician Assistant

## 2013-06-03 ENCOUNTER — Ambulatory Visit: Payer: 59 | Admitting: Oncology

## 2013-06-03 ENCOUNTER — Ambulatory Visit (HOSPITAL_BASED_OUTPATIENT_CLINIC_OR_DEPARTMENT_OTHER): Payer: 59

## 2013-06-03 ENCOUNTER — Telehealth: Payer: Self-pay | Admitting: Oncology

## 2013-06-03 VITALS — BP 126/87 | HR 80 | Temp 98.3°F | Resp 20 | Ht 62.0 in | Wt 146.2 lb

## 2013-06-03 DIAGNOSIS — C50912 Malignant neoplasm of unspecified site of left female breast: Secondary | ICD-10-CM

## 2013-06-03 DIAGNOSIS — M8580 Other specified disorders of bone density and structure, unspecified site: Secondary | ICD-10-CM

## 2013-06-03 DIAGNOSIS — C50911 Malignant neoplasm of unspecified site of right female breast: Secondary | ICD-10-CM

## 2013-06-03 DIAGNOSIS — Z780 Asymptomatic menopausal state: Secondary | ICD-10-CM

## 2013-06-03 DIAGNOSIS — C50419 Malignant neoplasm of upper-outer quadrant of unspecified female breast: Secondary | ICD-10-CM

## 2013-06-03 DIAGNOSIS — Z171 Estrogen receptor negative status [ER-]: Secondary | ICD-10-CM

## 2013-06-03 DIAGNOSIS — M899 Disorder of bone, unspecified: Secondary | ICD-10-CM

## 2013-06-03 DIAGNOSIS — C50212 Malignant neoplasm of upper-inner quadrant of left female breast: Secondary | ICD-10-CM

## 2013-06-03 MED ORDER — SODIUM CHLORIDE 0.9 % IV SOLN
Freq: Once | INTRAVENOUS | Status: AC
  Administered 2013-06-03: 13:00:00 via INTRAVENOUS

## 2013-06-03 MED ORDER — ANASTROZOLE 1 MG PO TABS: 1.0000 mg | ORAL_TABLET | Freq: Every day | ORAL | Status: DC

## 2013-06-03 MED ORDER — ZOLEDRONIC ACID 4 MG/100ML IV SOLN
4.0000 mg | Freq: Once | INTRAVENOUS | Status: AC
  Administered 2013-06-03: 4 mg via INTRAVENOUS
  Filled 2013-06-03: qty 100

## 2013-06-03 NOTE — Progress Notes (Signed)
ID: Al Pimple   DOB: June 12, 1956  MR#: [id number]  WUJ#:[id number]  PCP: Caroline Stabile, MD GYN:  SU: Caroline Mejia OTHER MD: Caroline Mejia, Caroline Mejia   HISTORY OF PRESENT ILLNESS: Caroline Mejia was followed by my former partner, Caroline Mejia, for a right-sided breast cancer dating back to Caroline Mejia of 2006. I have summarized that data below. More recently, routine screening mammography 02/17/Mejia at the [facility] Center showed a possible mass in the left breast. Diagnostic left mammogram and ultrasonography 02/27/Mejia showed a persistent density in the upper inner left breast, which was not palpable by exam. Ultrasound confirmed a vertically oriented hypoechoic mass measuring 0.9 cm. The left axilla was benign.  Biopsy of the left breast mass 03/05/Mejia showed ([id number]) an invasive ductal carcinoma, low-grade, estrogen and progesterone receptors both positive at 100%, with an MIB-1 of 8%, and no HER-2 amplification by CISH. On 04/22/Mejia the patient underwent left mastectomy with sentinel lymph node sampling. The final pathology (SZA [id number]) showed an invasive ductal carcinoma, grade 2, maximal size 0.9 cm, with both sentinel lymph nodes negative, and ample margins.  The patient's subsequent history is as detailed below.  INTERVAL HISTORY: Caroline Mejia returns today for followup of her bilateral breast carcinomas. Most recently, she underwent left mastectomy for a left-sided invasive ductal carcinoma and Caroline Mejia. She is now ready to initiate antiestrogen therapy, in addition to zoledronic acid for history of osteopenia.  Interval history is unremarkable, and Caroline Mejia has recovered well from her surgery. She is feeling well today with no complaints.  REVIEW OF SYSTEMS: Caroline Mejia  denies any fevers, chills, or night sweats. She has no significant hot flashes. She denies any vaginal dryness. She's had no signs of abnormal bruising or bleeding. Her energy level is good. She's had no jaw  pain, dental procedures, or extractions, and recently saw her dentist in June. She tells me everything was "fine". Her appetite is good and she denies any nausea or change in bowel or bladder habits. She's had no cough, shortness of breath, or chest pain. She denies any abnormal headaches or dizziness, and also denies any unusual myalgias, arthralgias, or bony pain. She has no significant signs of arthritis. She's had no peripheral swelling.  A detailed review of systems is otherwise stable and noncontributory.   PAST MEDICAL HISTORY: Past Medical History  Diagnosis Date  . GERD (gastroesophageal reflux disease)   . H/O hiatal hernia   . Breast cancer     bilaterally; "have had chemo & radiation on right side only"  (4/22/Mejia)    PAST SURGICAL HISTORY: Past Surgical History  Procedure Laterality Date  . Tubal ligation  1983  . Reconstruction breast w/ tram flap Right 2008  . Breast reduction surgery Left 2009  . Breast lumpectomy Right 2006  . Breast surgery    . Breast biopsy Right 2006; 2008  . Breast biopsy Left 3/Mejia  . Mastectomy Right 2008  . Mastectomy Left 4/22/Mejia  . Reconstruction breast w/ latissimus dorsi flap Left 4/22/Mejia  . Mastectomy w/ sentinel node biopsy Left 4/22/Mejia    Procedure: LEFT TOTAL MASTECTOMY WITH SENTINEL LYMPH NODE BIOPSY;  Surgeon: Caroline Mention, MD;  Location: [facility] OR;  Service: General;  Laterality: Left;  . Latissimus flap to breast Left 4/22/Mejia    Procedure: LEFT LATISSIMUS FLAP TO BREAST WITH SALINE IMPLANT;  Surgeon: Caroline Sjogren, MD;  Location: [facility] OR;  Service: Plastics;  Laterality: Left;    FAMILY HISTORY Family History  Problem Relation Age of Onset  .  Heart disease Mother   . Heart disease Father   . Cancer Father    the patient's father died from congestive heart failure at the age of 24. The patient's mother is currently 45 years old. The patient has one brother, 2 sisters. There is no history of breast or ovarian cancer in  the family.  GYNECOLOGIC HISTORY: Menarche age 56, first live birth age 44. The patient is GX P2. She went through menopause with her first chemotherapy, in early 2006. She never took hormone replacement.  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 At home she lives with her mother. They share a cat. Daughter Caroline Mejia works as a Solicitor for a [employer]. Son Caroline Mejia works as a Academic librarian in [place]. The patient has 6 grandchildren. She attends [employer]   ADVANCED DIRECTIVES: Not in place but were discussed with the patient may want Mejia. She states she has the papers and is intending to complete dense.  HEALTH MAINTENANCE: History  Substance Use Topics  . Smoking status: Never Smoker   . Smokeless tobacco: Never Used  . Alcohol Use: 0.0 oz/week     Comment: 4/22/Mejia "glass of wine ~ once/month"     Colonoscopy: 2008/ [facility]  PAP: May 2013  Bone density: February 2012; osteopenia (T -1.9)  Lipid panel:  No Known Allergies  Current Outpatient Prescriptions  Medication Sig Dispense Refill  . anastrozole (ARIMIDEX) 1 MG tablet Take 1 tablet (1 mg total) by mouth daily.  90 tablet  3  . calcium carbonate (OS-CAL) 600 MG TABS Take 600 mg by mouth 2 (two) times daily with a meal.        . docusate sodium 100 MG CAPS Take 100 mg by mouth daily.  30 capsule  0  . omeprazole (PRILOSEC) 20 MG capsule Take 20 mg by mouth daily.        . simvastatin (ZOCOR) 40 MG tablet Take 40 mg by mouth at bedtime.         No current facility-administered medications for this visit.   Facility-Administered Medications Ordered in Other Visits  Medication Dose Route Frequency Provider Last Rate Last Dose  . Zoledronic Acid (ZOMETA) 4 mg IVPB  4 mg Intravenous Once Caroline Biernat Allegra Grana, PA-C        OBJECTIVE: Middle-aged white woman in no acute distress Filed Vitals:   06/03/13 1254  BP: 126/87  Pulse: 80  Temp: 98.3 F (36.8 C)   Resp: 20  Body mass index is 26.73 kg/(m^2). Filed Weights   06/03/13 1254  Weight: 146 lb 3.2 oz (66.316 kg)  ECOG FS: 0  Sclerae unicteric Oropharynx clear No cervical or supraclavicular adenopathy Lungs clear to auscultation bilaterally, no rales or rhonchi Heart regular rate and rhythm, no murmur appreciated Abd soft, nontender to palpation, positive bowel sounds MSK no focal spinal tenderness, no peripheral edema; Neuro: nonfocal, well oriented, pleasant affect Breasts: Status post bilateral mastectomies, with TRAM flap on the right and no latissimus flap on the left. No evidence of local recurrence. Both axillae are benign, no palpable adenopathy.   LAB RESULTS: Lab Results  Component Value Date   WBC 6.4 8/19/Mejia   NEUTROABS 2.9 8/19/Mejia   HGB 14.7 8/19/Mejia   HCT 44.6 8/19/Mejia   MCV 85.3 8/19/Mejia   PLT 289 8/19/Mejia      Chemistry      Component Value Date/Time   NA 143 8/19/Mejia 1036   NA 143  4/15/Mejia 0829   K 4.0 8/19/Mejia 1036   K 4.2 4/15/Mejia 0829   CL 106 5/1/Mejia 1605   CL 106 4/15/Mejia 0829   CO2 25 8/19/Mejia 1036   CO2 29 4/15/Mejia 0829   BUN 9.9 8/19/Mejia 1036   BUN 9 4/15/Mejia 0829   CREATININE 0.8 8/19/Mejia 1036   CREATININE 0.92 4/15/Mejia 0829      Component Value Date/Time   CALCIUM 9.8 8/19/Mejia 1036   CALCIUM 9.9 4/15/Mejia 0829   ALKPHOS 70 8/19/Mejia 1036   ALKPHOS 61 4/15/Mejia 0829   AST 28 8/19/Mejia 1036   AST 25 4/15/Mejia 0829   ALT 24 8/19/Mejia 1036   ALT 19 4/15/Mejia 0829   BILITOT 0.91 8/19/Mejia 1036   BILITOT 0.9 4/15/Mejia 0829      STUDIES:  No results found.      ASSESSMENT: 57 y.o. BRCA negative [place]  (1) status post right breast biopsy 12/07/2004 for a clinical T2 N0, stage IIA invasive ductal carcinoma, grade 3, initially read as triple negative  (2) treated neoadjuvant [place] with cyclophosphamide and doxorubicin in dose dense fashion x2, followed by fluorouracil, epirubicin and cyclophosphamide  in dose dense fashion x2, followed by Taxotere x4, completed 04/20/2005.  (3) status post right mastectomy with sentinel lymph node sampling 05/11/2005 for a residual pT1c pN0 invasive ductal carcinoma, grade 3, again estrogen and progesterone receptor negative, with an MIB-1 of 52%, but HER-2 amplified by FISH with a ratio of 3.75  (4) adjuvant radiation completed 08/02/2005  (5) recurrence to the right breast documented February of 2008, status post right mastectomy 12/27/2006 for a pT1b NX invasive ductal carcinoma, again estrogen and progesterone receptor negative, with an MIB-1 of 64%, and HER-2 amplified by fish with a ratio of 3.58.  (6) adjuvant chemotherapy with weekly carboplatin (x7), Abraxane (x9)  and trastuzumab completed 05/08/2007. Trastuzumab was continued to May 2009, to total one year  (7) left mastectomy with sentinel lymph node sampling 04/22/Mejia for a pT1c pN0, stage IA invasive ductal carcinoma, grade 1, estrogen and progesterone receptor both 100% positive, with an MIB-1 of 8%, and no HER-2 amplification  (8) status post remote TRAM flap reconstruction on the right, latissimus flap reconstruction on the left  (9) starting anastrozole, August Mejia  (10) osteopenia, being treated in early with zoledronic acid, first dose on 08/26/Mejia   PLAN: Sherine is ready to start her anastrozole. We've reviewed once again on the possible side effects associated with the anastrozole, and she was given all of this information in writing today. The plan will be to continue anastrozole for at least 2 years, then evaluated whether or not to switch to tamoxifen, or simply continue on anastrozole for total of 5 years.  In the meanwhile, she'll receive her first dose of zoledronic acid today for osteopenia. We also reviewed possible side effects and toxicities associated with zoledronic acid, and she voices her understanding. I encouraged her to chew a few extra toms, and keep herself well  hydrated over the next couple of days.  We will see Deseray back for routine followup in 3 months. If at that time she is tolerating the anastrozole well, we will likely begin alternating her visits with Dr. Derrell Lolling every 6 months, so that essentially she is evaluated every 3 months by either our office or his office. She voices understanding and agreement with the above plan, and will call with any changes or problems.   Cola Highfill    8/26/Mejia

## 2013-06-03 NOTE — Patient Instructions (Signed)
[facility] Discharge Instructions for Patients Today you received the following agents Zometa.  Please keep your regularly scheduled appointments.    If you develop any of the following symptoms and they are not resolved by prescribed medication, call the clinic.   BELOW ARE SYMPTOMS THAT SHOULD BE REPORTED IMMEDIATELY:  *FEVER GREATER THAN 100.5 F  *CHILLS WITH OR WITHOUT FEVER  NAUSEA AND VOMITING THAT IS NOT CONTROLLED WITH YOUR NAUSEA MEDICATION  *UNUSUAL SHORTNESS OF BREATH  *UNUSUAL BRUISING OR BLEEDING  TENDERNESS IN MOUTH AND THROAT WITH OR WITHOUT PRESENCE OF ULCERS  *URINARY PROBLEMS  *BOWEL PROBLEMS  UNUSUAL RASH Items with * indicate a potential emergency and should be followed up as soon as possible.  Feel free to call the clinic you have any questions or concerns. The clinic phone number is [phone].     Zoledronic Acid injection (Hypercalcemia, Oncology) What is this medicine? ZOLEDRONIC ACID (ZOE le dron ik AS id) lowers the amount of calcium loss from bone. It is used to treat too much calcium in your blood from cancer. It is also used to prevent complications of cancer that has spread to the bone. This medicine may be used for other purposes; ask your health care provider or pharmacist if you have questions. What should I tell my health care provider before I take this medicine? They need to know if you have any of these conditions: -aspirin-sensitive asthma -dental disease -kidney disease -an unusual or allergic reaction to zoledronic acid, other medicines, foods, dyes, or preservatives -pregnant or trying to get pregnant -breast-feeding How should I use this medicine? This medicine is for infusion into a vein. It is given by a health care professional in a hospital or clinic setting. Talk to your pediatrician regarding the use of this medicine in children. Special care may be needed. Overdosage: If you think you have taken too  much of this medicine contact a poison control center or emergency room at once. NOTE: This medicine is only for you. Do not share this medicine with others. What if I miss a dose? It is important not to miss your dose. Call your doctor or health care professional if you are unable to keep an appointment. What may interact with this medicine? -certain antibiotics given by injection -NSAIDs, medicines for pain and inflammation, like ibuprofen or naproxen -some diuretics like bumetanide, furosemide -teriparatide -thalidomide This list may not describe all possible interactions. Give your health care provider a list of all the medicines, herbs, non-prescription drugs, or dietary supplements you use. Also tell them if you smoke, drink alcohol, or use illegal drugs. Some items may interact with your medicine. What should I watch for while using this medicine? Visit your doctor or health care professional for regular checkups. It may be some time before you see the benefit from this medicine. Do not stop taking your medicine unless your doctor tells you to. Your doctor may order blood tests or other tests to see how you are doing. Women should inform their doctor if they wish to become pregnant or think they might be pregnant. There is a potential for serious side effects to an unborn child. Talk to your health care professional or pharmacist for more information. You should make sure that you get enough calcium and vitamin D while you are taking this medicine. Discuss the foods you eat and the vitamins you take with your health care professional. Some people who take this medicine have severe bone, joint, and/or   muscle pain. This medicine may also increase your risk for a broken thigh bone. Tell your doctor right away if you have pain in your upper leg or groin. Tell your doctor if you have any pain that does not go away or that gets worse. What side effects may I notice from receiving this  medicine? Side effects that you should report to your doctor or health care professional as soon as possible: -allergic reactions like skin rash, itching or hives, swelling of the face, lips, or tongue -anxiety, confusion, or depression -breathing problems -changes in vision -feeling faint or lightheaded, falls -jaw burning, cramping, pain -muscle cramps, stiffness, or weakness -trouble passing urine or change in the amount of urine Side effects that usually do not require medical attention (report to your doctor or health care professional if they continue or are bothersome): -bone, joint, or muscle pain -fever -hair loss -irritation at site where injected -loss of appetite -nausea, vomiting -stomach upset -tired This list may not describe all possible side effects. Call your doctor for medical advice about side effects. You may report side effects to FDA at 1-800-FDA-1088. Where should I keep my medicine? This drug is given in a hospital or clinic and will not be stored at home. NOTE: This sheet is a summary. It may not cover all possible information. If you have questions about this medicine, talk to your doctor, pharmacist, or health care provider.  2013, [employer]. (03/24/2011 9:06:58 AM)  

## 2013-06-07 DIAGNOSIS — C50212 Malignant neoplasm of upper-inner quadrant of left female breast: Secondary | ICD-10-CM | POA: Insufficient documentation

## 2013-06-07 DIAGNOSIS — C50911 Malignant neoplasm of unspecified site of right female breast: Secondary | ICD-10-CM | POA: Insufficient documentation

## 2013-06-07 NOTE — Progress Notes (Signed)
ID: Caroline Mejia   DOB: 11-08-1955  MR#: [id number]  NWG#:[id number]  PCP: Benita Stabile, MD GYN:  SU: Claud Kelp OTHER MD: Etter Sjogren, Molli Hazard manning   HISTORY OF PRESENT ILLNESS: Caroline Mejia was followed by my former partner, [name], for a right-sided breast cancer dating back to March of 2006. I have summarized that data below. More recently, routine screening mammography 11/25/2012 at the breast Center showed a possible mass in the left breast. Diagnostic left mammogram and ultrasonography 12/05/2012 showed a persistent density in the upper inner left breast, which was not palpable by exam. Ultrasound confirmed a vertically oriented hypoechoic mass measuring 0.9 cm. The left axilla was benign.  Biopsy of the left breast mass 12/11/2012 showed ([id number]) an invasive ductal carcinoma, low-grade, estrogen and progesterone receptors both positive at 100%, with an MIB-1 of 8%, and no HER-2 amplification by CISH. On 01/28/2013 the patient underwent left mastectomy with sentinel lymph node sampling. The final pathology ([id number] [id number]) showed an invasive ductal carcinoma, grade 2, maximal size 0.9 cm, with both sentinel lymph nodes negative, and ample margins.  The patient's subsequent history is as detailed below.  INTERVAL HISTORY: [name] returns for followup of her breast cancer. She was started on aromatase inhibitors 3 months ago. So far she is tolerating them well, with mild hot flashes as her only side effect. She also had a DEXA scan in July which showed significant osteopenia  REVIEW OF SYSTEMS: Aside from the mild hot flashes, she does complain of some vaginal dryness issues, which are however not worse than prior. Otherwise a detailed review of systems today was noncontributory  PAST MEDICAL HISTORY: Past Medical History  Diagnosis Date  . GERD (gastroesophageal reflux disease)   . H/O hiatal hernia   . Breast cancer     bilaterally; "have had chemo &  radiation on right side only"  (01/28/2013)    PAST SURGICAL HISTORY: Past Surgical History  Procedure Laterality Date  . Tubal ligation  1983  . Reconstruction breast w/ tram flap Right 2008  . Breast reduction surgery Left 2009  . Breast lumpectomy Right 2006  . Breast surgery    . Breast biopsy Right 2006; 2008  . Breast biopsy Left 12/2012  . Mastectomy Right 2008  . Mastectomy Left 01/28/2013  . Reconstruction breast w/ latissimus dorsi flap Left 01/28/2013  . Mastectomy w/ sentinel node biopsy Left 01/28/2013    Procedure: LEFT TOTAL MASTECTOMY WITH SENTINEL LYMPH NODE BIOPSY;  Surgeon: Ernestene Mention, MD;  Location: [facility] OR;  Service: General;  Laterality: Left;  . Latissimus flap to breast Left 01/28/2013    Procedure: LEFT LATISSIMUS FLAP TO BREAST WITH SALINE IMPLANT;  Surgeon: Etter Sjogren, MD;  Location: [facility];  Service: Plastics;  Laterality: Left;    FAMILY HISTORY Family History  Problem Relation Age of Onset  . Heart disease Mother   . Heart disease Father   . Cancer Father    the patient's father died from congestive heart failure at the age of 58. The patient's mother is currently 50 years old. The patient has one brother, 2 sisters. There is no history of breast or ovarian cancer in the family.  GYNECOLOGIC HISTORY: Menarche age 17, first live birth age 10. The patient is GX P2. She went through menopause with her first chemotherapy, in early 2006. She never took hormone replacement.  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 At home she lives with her mother. They share  a cat. Daughter Caroline Mejia works as a Solicitor for a [employer]. Son Caroline Mejia works as a Academic librarian in [place]. The patient has 6 grandchildren. She attends [employer]   ADVANCED DIRECTIVES: Not in place but were discussed with the patient may want 2014. She states she has the papers and is intending to complete these  HEALTH  MAINTENANCE: History  Substance Use Topics  . Smoking status: Never Smoker   . Smokeless tobacco: Never Used  . Alcohol Use: 0.0 oz/week     Comment: 01/28/2013 "glass of wine ~ once/month"     Colonoscopy: 2008/ [facility]  PAP: May 2013  Bone density: February 2012; osteopenia (T -1.9)  Lipid panel:  No Known Allergies  Current Outpatient Prescriptions  Medication Sig Dispense Refill  . anastrozole (ARIMIDEX) 1 MG tablet Take 1 tablet (1 mg total) by mouth daily.  90 tablet  3  . calcium carbonate (OS-CAL) 600 MG TABS Take 600 mg by mouth 2 (two) times daily with a meal.        . docusate sodium 100 MG CAPS Take 100 mg by mouth daily.  30 capsule  0  . omeprazole (PRILOSEC) 20 MG capsule Take 20 mg by mouth daily.        . simvastatin (ZOCOR) 40 MG tablet Take 40 mg by mouth at bedtime.         No current facility-administered medications for this visit.    OBJECTIVE: Middle-aged white woman who appears stated age 57 Vitals:   06/03/13 1141  BP: 126/87  Pulse: 80  Temp: 98.3 F (36.8 C)  Resp: 20     Body mass index is 26.73 kg/(m^2).    ECOG FS: 1  Sclerae unicteric, pupils equal round and reactive to light Oropharynx clear No cervical or supraclavicular adenopathy Lungs no rales or rhonchi Heart regular rate and rhythm Abd soft nontender positive bowel sounds MSK no focal spinal tenderness, no peripheral edema; the bandages over the latissimus flap are still in place, as are 2 drains. There is scant serous sanguinous fluid in the bulbs Neuro: nonfocal, well oriented, pleasant affect Breasts: Status post bilateral mastectomies, with TRAM flap on the right and  latissimus flap on the left. No evidence of local recurrence. Both axillae are benign.   LAB RESULTS: Lab Results  Component Value Date   WBC 6.4 05/27/2013   NEUTROABS 2.9 05/27/2013   HGB 14.7 05/27/2013   HCT 44.6 05/27/2013   MCV 85.3 05/27/2013   PLT 289 05/27/2013      Chemistry      Component Value  Date/Time   NA 143 05/27/2013 1036   NA 143 01/21/2013 0829   K 4.0 05/27/2013 1036   K 4.2 01/21/2013 0829   CL 106 02/06/2013 1605   CL 106 01/21/2013 0829   CO2 25 05/27/2013 1036   CO2 29 01/21/2013 0829   BUN 9.9 05/27/2013 1036   BUN 9 01/21/2013 0829   CREATININE 0.8 05/27/2013 1036   CREATININE 0.92 01/21/2013 0829      Component Value Date/Time   CALCIUM 9.8 05/27/2013 1036   CALCIUM 9.9 01/21/2013 0829   ALKPHOS 70 05/27/2013 1036   ALKPHOS 61 01/21/2013 0829   AST 28 05/27/2013 1036   AST 25 01/21/2013 0829   ALT 24 05/27/2013 1036   ALT 19 01/21/2013 0829   BILITOT 0.91 05/27/2013 1036   BILITOT 0.9 01/21/2013 0829       Lab Results  Component  Value Date   LABCA2 48* 01/21/2013    No components found with this basename: [id number]    No results found for this basename: INR,  in the last 168 hours  Urinalysis    Component Value Date/Time   COLORURINE YELLOW 10/18/2007 1635   APPEARANCEUR CLEAR 10/18/2007 1635   LABSPEC 1.010 10/18/2007 1635   PHURINE 5.5 10/18/2007 1635   GLUCOSEU NEGATIVE 10/18/2007 1635   HGBUR NEGATIVE 10/18/2007 1635   BILIRUBINUR NEGATIVE 10/18/2007 1635   KETONESUR NEGATIVE 10/18/2007 1635   PROTEINUR NEGATIVE 10/18/2007 1635   UROBILINOGEN 0.2 10/18/2007 1635   NITRITE NEGATIVE 10/18/2007 1635   LEUKOCYTESUR SMALL* 10/18/2007 1635    STUDIES: Clinical Data: [age]-year-old postmenopausal female with history of  low bone mass and breast cancer. The patient takes calcium. She  is to begin anastrozole in August.  DUAL X-RAY ABSORPTIOMETRY (DXA) FOR BONE MINERAL DENSITY  AP LUMBAR SPINE (L1 - L4)  Bone Mineral Density (BMD): 0.831 g/cm2  Young Adult T Score: -2.0  Z Score: -0.7  LEFT FEMUR (NECK)  Bone Mineral Density (BMD): 0.615 g/cm2  Young Adult T Score: -2.1  Z Score: -0.9  ASSESSMENT: Patient's diagnostic category is LOW BONE MASS by WHO  Criteria.  FRACTURE RISK: MODERATE  FRAX: Based on the World Health Organization FRAX model, the 10  year probability of a  major osteoporotic fracture is 9.1%. The 10  year probability of a hip fracture is 1.1%.  Comparison: There has been a statistically significant 3.5%  decrease in BMD in the spine and no significant change in BMD in  the total left hip as compared to 06/12/2008. There has been no  significant change in BMD in the spine or total left hip as  compared to 11/21/2010.   localization.      ASSESSMENT: 57 y.o. BRCA negative [place]  (1) status post right breast biopsy 12/07/2004 for a clinical T2 N0, stage IIA invasive ductal carcinoma, grade 3, initially read as triple negative  (2) treated neoadjuvant [place] with cyclophosphamide and doxorubicin in dose dense fashion x2, followed by fluorouracil, epirubicin and cyclophosphamide in dose dense fashion x2, followed by Taxotere x4, completed 04/20/2005.  (3) status post right mastectomy with sentinel lymph node sampling 05/11/2005 for a residual pT1c pN0 invasive ductal carcinoma, grade 3, again estrogen and progesterone receptor negative, with an MIB-1 of 52%, but HER-2 amplified by FISH with a ratio of 3.75  (4) adjuvant radiation completed 08/02/2005  (5) recurrence to the right breast documented February of 2008, status post right mastectomy 12/27/2006 for a pT1b NX invasive ductal carcinoma, again estrogen and progesterone receptor negative, with an MIB-1 of 64%, and HER-2 amplified by fish with a ratio of 3.58.  (6) adjuvant chemotherapy with weekly carboplatin (x7), Abraxane (x9)  and trastuzumab completed 05/08/2007. Trastuzumab was continued to May 2009, to total one year  (7) left mastectomy with sentinel lymph node sampling 01/28/2013 for a pT1c pN0, stage IA invasive ductal carcinoma, grade 1, estrogen and progesterone receptor both 100% positive, with an MIB-1 of 8%, and no HER-2 amplification  (8) status post remote TRAM flap reconstruction on the right, latissimus flap reconstruction on the left  (9) started anastrozole May  2014  (10) osteopenia documented on DEXA scan July 2014; zolendronate started 06/03/2013  PLAN: Jenean is doing well with the anastrozole and the plan is to continue that for 5 years. Because of concerns regarding bone density, we are starting her on zolendronate today. Just a good  understanding of the possible toxicities, side effects, and complications of that medication including problems with osteonecrosis of the jaw. She is agreeable to proceeding. She also understands the possible benefits regarding breast cancer recurrence risk reduction from bisphosphonates.  She'll see [place] again in 3 months. She knows to call for any problems that may develop before that visit. MAGRINAT,GUSTAV C    06/07/2013

## 2013-08-14 ENCOUNTER — Other Ambulatory Visit: Payer: Self-pay

## 2013-08-21 ENCOUNTER — Ambulatory Visit (INDEPENDENT_AMBULATORY_CARE_PROVIDER_SITE_OTHER): Payer: 59 | Admitting: General Surgery

## 2013-08-21 ENCOUNTER — Encounter (INDEPENDENT_AMBULATORY_CARE_PROVIDER_SITE_OTHER): Payer: Self-pay | Admitting: General Surgery

## 2013-08-21 VITALS — BP 138/94 | HR 76 | Temp 97.9°F | Resp 15 | Ht 62.0 in | Wt 147.4 lb

## 2013-08-21 DIAGNOSIS — C50212 Malignant neoplasm of upper-inner quadrant of left female breast: Secondary | ICD-10-CM

## 2013-08-21 DIAGNOSIS — C50911 Malignant neoplasm of unspecified site of right female breast: Secondary | ICD-10-CM

## 2013-08-21 DIAGNOSIS — C50219 Malignant neoplasm of upper-inner quadrant of unspecified female breast: Secondary | ICD-10-CM

## 2013-08-21 DIAGNOSIS — C50919 Malignant neoplasm of unspecified site of unspecified female breast: Secondary | ICD-10-CM

## 2013-08-21 NOTE — Progress Notes (Signed)
Patient ID: Caroline Mejia, female   DOB: [birth year], 57 y.o.   MRN: [id number]  History: This patient  underwent left total mastectomy, left axilla sentinel node biopsy,  latissimus flap and implant based reconstruction by Dr. Odis Luster on 01/28/2013.Marland Kitchen Final pathology shows a 1.4 cm invasive ductal carcinoma and associated DCIS. Nodes were negative. Final pathology is a stage T1c., N0, receptor positive, HER-2/neu negative. Negative margins.   Past past history is significant for a right partial mastectomy and SLN  biopsy in March 2006 for T2 N0 cancer. She developed a recurrence...underwent a right mastectomy with reconstruction in March 2008.  She is doing well. She has full range of motion of her left shoulder. Essentially no pain. She has seen Dr. Darnelle Catalan is on anastrozole .    Exam: Patient looks well. No distress.  Neck reveals no adenopathy or mass Lungs clear to auscultation bilaterally Heart regular rate and rhythm. No murmur. No ectopy. Left mastectomy skin flaps healthy. No nodules. No infection. No drainage. No necrosis. Reconstruction looks good. Tissues are soft. Symmetry was good. Back incision looks good. Range of motion left shoulder 100%. Right mastectomy and TRAM flap also well healed. No nodules ulcerations or axillary adenopathy.    Assessment: 1)   Invasive ductal carcinoma left breast and DCIS, receptor positive, HER-2-negative, pathologic stage T1 C. N0  Doing well in the postop period following left total mastectomy, sentinel node biopsy, and latissimus flap reconstruction with implant  2)Remote history right breast cancer as detailed above    Plan:  Continue the arimidex and follow up with Dr. Darnelle Catalan  Return to see me in one year     Lilleigh Hechavarria M. Derrell Lolling, M.D., [facility]  General and Minimally invasive Surgery  Breast and Colorectal Surgery  Office: [phone]  Pager: [phone]

## 2013-08-21 NOTE — Patient Instructions (Signed)
Your physical exam today is normal. The breast skin and all the lymph nodes feel normal. There is no evidence of cancer.  Continue to take the arimidex medication and keep all of your regular appointments with Dr. Darnelle Catalan  Return to see Dr. Derrell Lolling in one year for a physical exam.

## 2013-09-02 ENCOUNTER — Ambulatory Visit (HOSPITAL_BASED_OUTPATIENT_CLINIC_OR_DEPARTMENT_OTHER): Payer: 59 | Admitting: Oncology

## 2013-09-02 ENCOUNTER — Other Ambulatory Visit (HOSPITAL_BASED_OUTPATIENT_CLINIC_OR_DEPARTMENT_OTHER): Payer: 59 | Admitting: Lab

## 2013-09-02 ENCOUNTER — Telehealth: Payer: Self-pay | Admitting: *Deleted

## 2013-09-02 VITALS — BP 145/91 | HR 75 | Temp 97.8°F | Resp 18 | Ht 62.0 in | Wt 147.6 lb

## 2013-09-02 DIAGNOSIS — Z171 Estrogen receptor negative status [ER-]: Secondary | ICD-10-CM

## 2013-09-02 DIAGNOSIS — C50212 Malignant neoplasm of upper-inner quadrant of left female breast: Secondary | ICD-10-CM

## 2013-09-02 DIAGNOSIS — C50919 Malignant neoplasm of unspecified site of unspecified female breast: Secondary | ICD-10-CM

## 2013-09-02 DIAGNOSIS — C50912 Malignant neoplasm of unspecified site of left female breast: Secondary | ICD-10-CM

## 2013-09-02 DIAGNOSIS — C50911 Malignant neoplasm of unspecified site of right female breast: Secondary | ICD-10-CM

## 2013-09-02 DIAGNOSIS — M899 Disorder of bone, unspecified: Secondary | ICD-10-CM

## 2013-09-02 DIAGNOSIS — M8580 Other specified disorders of bone density and structure, unspecified site: Secondary | ICD-10-CM

## 2013-09-02 LAB — COMPREHENSIVE METABOLIC PANEL (CC13)
ALT: 22 U/L (ref 0–55)
AST: 26 U/L (ref 5–34)
Alkaline Phosphatase: 49 U/L (ref 40–150)
BUN: 13.7 mg/dL (ref 7.0–26.0)
Calcium: 9.9 mg/dL (ref 8.4–10.4)
Chloride: 107 mEq/L (ref 98–109)
Creatinine: 0.9 mg/dL (ref 0.6–1.1)
Potassium: 4.1 mEq/L (ref 3.5–5.1)

## 2013-09-02 LAB — CBC WITH DIFFERENTIAL/PLATELET
BASO%: 0.6 % (ref 0.0–2.0)
EOS%: 1.3 % (ref 0.0–7.0)
MCH: 27.5 pg (ref 25.1–34.0)
MCHC: 32.4 g/dL (ref 31.5–36.0)
MCV: 84.8 fL (ref 79.5–101.0)
MONO%: 9.9 % (ref 0.0–14.0)
NEUT%: 36.1 % — ABNORMAL LOW (ref 38.4–76.8)
RDW: 14.8 % — ABNORMAL HIGH (ref 11.2–14.5)
lymph#: 3.4 10*3/uL — ABNORMAL HIGH (ref 0.9–3.3)

## 2013-09-02 NOTE — Progress Notes (Signed)
ID: Caroline Mejia   DOB: 12-16-1955  MR#: [id number]  WUJ#:[id number]  PCP: Lupe Carney, MD GYN:  SU: Claud Kelp OTHER MD: Etter Sjogren, Molli Hazard manning   HISTORY OF PRESENT ILLNESS: Caroline Mejia was followed by my former partner, [name], for a right-sided breast cancer dating back to March of 2006. I have summarized that data below. More recently, routine screening mammography 11/25/2012 at the breast Center showed a possible mass in the left breast. Diagnostic left mammogram and ultrasonography 12/05/2012 showed a persistent density in the upper inner left breast, which was not palpable by exam. Ultrasound confirmed a vertically oriented hypoechoic mass measuring 0.9 cm. The left axilla was benign.  Biopsy of the left breast mass 12/11/2012 showed ([id number]) an invasive ductal carcinoma, low-grade, estrogen and progesterone receptors both positive at 100%, with an MIB-1 of 8%, and no HER-2 amplification by CISH. On 01/28/2013 the patient underwent left mastectomy with sentinel lymph node sampling. The final pathology ([id number] [id number]) showed an invasive ductal carcinoma, grade 2, maximal size 0.9 cm, with both sentinel lymph nodes negative, and ample margins.  The patient's subsequent history is as detailed below.  INTERVAL HISTORY: Caroline Mejia returns today for followup of her bilateral breast carcinomas. She started anastrozole in August and so far is tolerating it well. She has no change in her hot flashes and vaginal dryness is not an issue. Cost is also not an issue. It costs her $1.50 per month.  REVIEW OF SYSTEMS: Caroline Mejia denies unusual headaches, visual changes, nausea, vomiting, stiff neck, dizziness, or gait imbalance. There has been no cough, phlegm production, or pleurisy, no chest pain or pressure, and no change in bowel or bladder habits. Has been no fever, rash, bleeding, unexplained fatigue or unexplained weight loss. A detailed review of systems was otherwise entirely  negative.   PAST MEDICAL HISTORY: Past Medical History  Diagnosis Date  . GERD (gastroesophageal reflux disease)   . H/O hiatal hernia   . Breast cancer     bilaterally; "have had chemo & radiation on right side only"  (01/28/2013)    PAST SURGICAL HISTORY: Past Surgical History  Procedure Laterality Date  . Tubal ligation  1983  . Reconstruction breast w/ tram flap Right 2008  . Breast reduction surgery Left 2009  . Breast lumpectomy Right 2006  . Breast surgery    . Breast biopsy Right 2006; 2008  . Breast biopsy Left 12/2012  . Mastectomy Right 2008  . Mastectomy Left 01/28/2013  . Reconstruction breast w/ latissimus dorsi flap Left 01/28/2013  . Mastectomy w/ sentinel node biopsy Left 01/28/2013    Procedure: LEFT TOTAL MASTECTOMY WITH SENTINEL LYMPH NODE BIOPSY;  Surgeon: Ernestene Mention, MD;  Location: [facility] OR;  Service: General;  Laterality: Left;  . Latissimus flap to breast Left 01/28/2013    Procedure: LEFT LATISSIMUS FLAP TO BREAST WITH SALINE IMPLANT;  Surgeon: Etter Sjogren, MD;  Location: [facility] OR;  Service: Plastics;  Laterality: Left;    FAMILY HISTORY Family History  Problem Relation Age of Onset  . Heart disease Mother   . Heart disease Father   . Cancer Father    the patient's father died from congestive heart failure at the age of 36. The patient's mother is currently 57 years old. The patient has one brother, 2 sisters. There is no history of breast or ovarian cancer in the family.  GYNECOLOGIC HISTORY: Menarche age 52, first live birth age 48. The patient is GX P2. She went through menopause with her  first chemotherapy, in early 2006. She never took hormone replacement.  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 At home she lives with her mother. They share a cat. Daughter Caroline Mejia works as a Solicitor for a [employer]. Son Caroline Mejia Sports works as a Academic librarian in [place]. The patient has 6 grandchildren. She attends  [employer]   ADVANCED DIRECTIVES: Not in place but were discussed with the patient may want 2014. She states she has the papers and is intending to complete dense.  HEALTH MAINTENANCE: History  Substance Use Topics  . Smoking status: Never Smoker   . Smokeless tobacco: Never Used  . Alcohol Use: 0.0 oz/week     Comment: 01/28/2013 "glass of wine ~ once/month"     Colonoscopy: 2008/ [facility]  PAP: May 2013  Bone density: February 2012; osteopenia (T -1.9)  Lipid panel:  No Known Allergies  Current Outpatient Prescriptions  Medication Sig Dispense Refill  . anastrozole (ARIMIDEX) 1 MG tablet Take 1 tablet (1 mg total) by mouth daily.  90 tablet  3  . calcium carbonate (OS-CAL) 600 MG TABS Take 600 mg by mouth 2 (two) times daily with a meal.        . docusate sodium 100 MG CAPS Take 100 mg by mouth daily.  30 capsule  0  . omeprazole (PRILOSEC) 20 MG capsule Take 20 mg by mouth daily.        . simvastatin (ZOCOR) 40 MG tablet Take 40 mg by mouth at bedtime.         No current facility-administered medications for this visit.    OBJECTIVE: Middle-aged white woman who appears stated age  57 Vitals:   09/02/13 1012  BP: 145/91  Pulse: 75  Temp: 97.8 F (36.6 C)  Resp: 18  Body mass index is 26.99 kg/(m^2). Filed Weights   09/02/13 1012  Weight: 147 lb 9.6 oz (66.951 kg)   ECOG FS: 0  Sclerae unicteric, pupils equal and reactive Oropharynx clear and moist-- no thrush No cervical or supraclavicular adenopathy Lungs no rales or rhonchi Heart regular rate and rhythm Abd soft, nontender, positive bowel sounds MSK no focal spinal tenderness, no upper extremity lymphedema Neuro: nonfocal, well oriented, appropriate affect Breasts: Status post bilateral mastectomies with flap reconstruction. There is no evidence of local recurrence. Both axillae are benign     LAB RESULTS: Lab Results  Component Value Date   WBC 6.5 09/02/2013   NEUTROABS 2.3  09/02/2013   HGB 14.6 09/02/2013   HCT 45.1 09/02/2013   MCV 84.8 09/02/2013   PLT 265 09/02/2013      Chemistry      Component Value Date/Time   NA 143 05/27/2013 1036   NA 143 01/21/2013 0829   K 4.0 05/27/2013 1036   K 4.2 01/21/2013 0829   CL 106 02/06/2013 1605   CL 106 01/21/2013 0829   CO2 25 05/27/2013 1036   CO2 29 01/21/2013 0829   BUN 9.9 05/27/2013 1036   BUN 9 01/21/2013 0829   CREATININE 0.8 05/27/2013 1036   CREATININE 0.92 01/21/2013 0829      Component Value Date/Time   CALCIUM 9.8 05/27/2013 1036   CALCIUM 9.9 01/21/2013 0829   ALKPHOS 70 05/27/2013 1036   ALKPHOS 61 01/21/2013 0829   AST 28 05/27/2013 1036   AST 25 01/21/2013 0829   ALT 24 05/27/2013 1036   ALT 19 01/21/2013 0829   BILITOT 0.91 05/27/2013 1036  BILITOT 0.9 01/21/2013 0829      STUDIES:  Bone density 04/29/2013 at the breast Center shows a T score of -2.1 at the left femoral neck.  Next mammogram is due March 2015   ASSESSMENT: 57 y.o. BRCA negative [place] woman  (1) status post right breast biopsy 12/07/2004 for a clinical T2 N0, stage IIA invasive ductal carcinoma, grade 3, initially read as triple negative  (2) treated neoadjuvantly with cyclophosphamide and doxorubicin in dose dense fashion x2, followed by fluorouracil, epirubicin and cyclophosphamide in dose dense fashion x2, followed by Taxotere x4, completed 04/20/2005.  (3) status post right mastectomy with sentinel lymph node sampling 05/11/2005 for a residual pT1c pN0 invasive ductal carcinoma, grade 3, again estrogen and progesterone receptor negative, with an MIB-1 of 52%, but HER-2 amplified by FISH with a ratio of 3.75  (4) adjuvant radiation completed 08/02/2005  (5) recurrence to the right breast documented February of 2008, status post right mastectomy 12/27/2006 for a pT1b NX invasive ductal carcinoma, again estrogen and progesterone receptor negative, with an MIB-1 of 64%, and HER-2 amplified by fish with a ratio of  3.58.  (6) adjuvant chemotherapy with weekly carboplatin (x7), Abraxane (x9)  and trastuzumab completed 05/08/2007. Trastuzumab was continued to May 2009, to total one year  (7) left mastectomy with sentinel lymph node sampling 01/28/2013 for a pT1c pN0, stage IA invasive ductal carcinoma, grade 1, estrogen and progesterone receptor both 100% positive, with an MIB-1 of 8%, and no HER-2 amplification  (8) status post remote TRAM flap reconstruction on the right, latissimus flap reconstruction on the left  (9) started anastrozole, August 2014  (10) osteopenia, being treated with zoledronic acid, first dose on 06/03/2013   PLAN: Danielys is tolerating her anastrozole well. The plan is to continue that for 5 years. She will receive zoledronate yearly until she completes the 5 years of antiestrogen treatment. We'll see her again in 6 months. She knows to call for any problems that may develop before that visit. Kacyn Souder C    09/02/2013

## 2013-09-02 NOTE — Telephone Encounter (Signed)
appts made and printed...td 

## 2013-11-12 ENCOUNTER — Telehealth: Payer: Self-pay | Admitting: *Deleted

## 2013-11-12 NOTE — Telephone Encounter (Signed)
LM INFORMED THE PT THAT WE HAD HER SCHEDULED ON A HOLIDAY. [facility] APPT FOR 03/03/14 W/ LABS@ 8:15AM AND OV@ 8:45. MADE THE PT AWARE THAT I WILL MAIL A LETTER/AVS..Marland KitchenTD

## 2013-11-16 ENCOUNTER — Emergency Department (HOSPITAL_COMMUNITY)
Admission: EM | Admit: 2013-11-16 | Discharge: 2013-11-16 | Disposition: A | Discharge status: Home / Self Care | Payer: 59 | Source: Home / Self Care | Attending: Family Medicine | Admitting: Family Medicine

## 2013-11-16 ENCOUNTER — Encounter (HOSPITAL_COMMUNITY): Payer: Self-pay | Admitting: Emergency Medicine

## 2013-11-16 DIAGNOSIS — H04122 Dry eye syndrome of left lacrimal gland: Secondary | ICD-10-CM

## 2013-11-16 DIAGNOSIS — H04129 Dry eye syndrome of unspecified lacrimal gland: Secondary | ICD-10-CM

## 2013-11-16 NOTE — ED Provider Notes (Signed)
CSN: [id number]     Arrival date & time 11/16/13  0911 History   First MD Initiated Contact with Patient 11/16/13 [phone]     Chief Complaint  Patient presents with  . Conjunctivitis   (Consider location/radiation/quality/duration/timing/severity/associated sxs/prior Treatment) Patient is a 58 y.o. female presenting with conjunctivitis. The history is provided by the patient.  Conjunctivitis This is a new problem. The current episode started yesterday (dry eye sx , h/o same from chem rx., has been followed by ophth.). The problem has not changed since onset.Pertinent negatives include no chest pain and no abdominal pain.    Past Medical History  Diagnosis Date  . GERD (gastroesophageal reflux disease)   . H/O hiatal hernia   . Breast cancer     bilaterally; "have had chemo & radiation on right side only"  (01/28/2013)   Past Surgical History  Procedure Laterality Date  . Tubal ligation  1983  . Reconstruction breast w/ tram flap Right 2008  . Breast reduction surgery Left 2009  . Breast lumpectomy Right 2006  . Breast surgery    . Breast biopsy Right 2006; 2008  . Breast biopsy Left 12/2012  . Mastectomy Right 2008  . Mastectomy Left 01/28/2013  . Reconstruction breast w/ latissimus dorsi flap Left 01/28/2013  . Mastectomy w/ sentinel node biopsy Left 01/28/2013    Procedure: LEFT TOTAL MASTECTOMY WITH SENTINEL LYMPH NODE BIOPSY;  Surgeon: Adin Hector, MD;  Location: [facility];  Service: General;  Laterality: Left;  . Latissimus flap to breast Left 01/28/2013    Procedure: LEFT LATISSIMUS FLAP TO BREAST WITH SALINE IMPLANT;  Surgeon: Crissie Reese, MD;  Location: [facility];  Service: Plastics;  Laterality: Left;   Family History  Problem Relation Age of Onset  . Heart disease Mother   . Heart disease Father   . Cancer Father    History  Substance Use Topics  . Smoking status: Never Smoker   . Smokeless tobacco: Never Used  . Alcohol Use: 0.0 oz/week     Comment: 01/28/2013 "glass of  wine ~ once/month"   OB History   Grav Para Term Preterm Abortions TAB SAB Ect Mult Living                 Review of Systems  Constitutional: Negative.   HENT: Negative.   Eyes: Positive for redness and itching. Negative for photophobia, pain, discharge and visual disturbance.  Cardiovascular: Negative for chest pain.  Gastrointestinal: Negative for abdominal pain.    Allergies  Review of patient's allergies indicates no known allergies.  Home Medications   Current Outpatient Rx  Name  Route  Sig  Dispense  Refill  . anastrozole (ARIMIDEX) 1 MG tablet   Oral   Take 1 tablet (1 mg total) by mouth daily.   90 tablet   3   . calcium carbonate (OS-CAL) 600 MG TABS   Oral   Take 600 mg by mouth 2 (two) times daily with a meal.           . docusate sodium 100 MG CAPS   Oral   Take 100 mg by mouth daily.   30 capsule   0   . omeprazole (PRILOSEC) 20 MG capsule   Oral   Take 20 mg by mouth daily.           . simvastatin (ZOCOR) 40 MG tablet   Oral   Take 40 mg by mouth at bedtime.  BP 156/87  Pulse 96  Temp(Src) 97.2 F (36.2 C) (Oral)  Resp 18  SpO2 97% Physical Exam  Nursing note and vitals reviewed. Constitutional: She appears well-developed and well-nourished.  HENT:  Head: Normocephalic.  Right Ear: External ear normal.  Left Ear: External ear normal.  Mouth/Throat: Oropharynx is clear and moist.  Eyes: Conjunctivae and EOM are normal. Pupils are equal, round, and reactive to light. Right eye exhibits no discharge. Left eye exhibits no discharge. Left conjunctiva is not injected. Left conjunctiva has no hemorrhage.  Slit lamp exam:      The left eye shows no corneal abrasion, no corneal flare, no corneal ulcer, no fluorescein uptake and no anterior chamber bulge.    ED Course  Procedures (including critical care time) Labs Review Labs Reviewed - No data to display Imaging Review No results found.    MDM      Billy Fischer,  MD 11/16/13 [phone]

## 2013-11-16 NOTE — ED Notes (Signed)
Pt  Reports     Symptoms  Of red  Irritated    l  Eye          Pt   Reports     Symptoms  Started  Yesterday       Pt  denys  Any  Injury  denys  Any  Known  FB

## 2013-11-16 NOTE — Discharge Instructions (Signed)
Use artificial tears regularly and discuss with your eye doctor.

## 2014-01-08 ENCOUNTER — Other Ambulatory Visit: Payer: Self-pay | Admitting: *Deleted

## 2014-01-08 ENCOUNTER — Telehealth: Payer: Self-pay | Admitting: *Deleted

## 2014-01-08 NOTE — Telephone Encounter (Signed)
Patient called and discussed issues she is having with severe reflux/belching and even an episode of vomiting, not associated with nausea. Patient believes it to be her Arimidex, however that is not a high side effect and less likely with patient past history of reflux/hiatal hernia. Instructed patient to discontinue Arimidex a few days, continue with Prilosec and then call us back with results. If it does not resolve patient should see her primary or possibly have GI work up of EGD to determine causing factor of her symptoms. Patient verbalized understanding.

## 2014-01-13 ENCOUNTER — Telehealth: Payer: Self-pay | Admitting: Oncology

## 2014-01-13 NOTE — Telephone Encounter (Signed)
lmonvm advising the pt of her may appts for the lab and for the md visit.

## 2014-02-24 ENCOUNTER — Other Ambulatory Visit (HOSPITAL_BASED_OUTPATIENT_CLINIC_OR_DEPARTMENT_OTHER): Payer: 59

## 2014-02-24 DIAGNOSIS — C50919 Malignant neoplasm of unspecified site of unspecified female breast: Secondary | ICD-10-CM

## 2014-02-24 LAB — CBC & DIFF AND RETIC
BASO%: 0.5 % (ref 0.0–2.0)
Basophils Absolute: 0 10*3/uL (ref 0.0–0.1)
EOS%: 1.5 % (ref 0.0–7.0)
Eosinophils Absolute: 0.1 10*3/uL (ref 0.0–0.5)
HEMATOCRIT: 44.6 % (ref 34.8–46.6)
HGB: 15.1 g/dL (ref 11.6–15.9)
IMMATURE RETIC FRACT: 6.5 % (ref 1.60–10.00)
LYMPH#: 2.8 10*3/uL (ref 0.9–3.3)
LYMPH%: 47.3 % (ref 14.0–49.7)
MCH: 29.4 pg (ref 25.1–34.0)
MCHC: 33.9 g/dL (ref 31.5–36.0)
MCV: 86.9 fL (ref 79.5–101.0)
MONO#: 0.6 10*3/uL (ref 0.1–0.9)
MONO%: 10.1 % (ref 0.0–14.0)
NEUT#: 2.4 10*3/uL (ref 1.5–6.5)
NEUT%: 40.6 % (ref 38.4–76.8)
Platelets: 270 10*3/uL (ref 145–400)
RBC: 5.13 10*6/uL (ref 3.70–5.45)
RDW: 13.2 % (ref 11.2–14.5)
RETIC %: 1.28 % (ref 0.70–2.10)
RETIC CT ABS: 65.66 10*3/uL (ref 33.70–90.70)
WBC: 5.8 10*3/uL (ref 3.9–10.3)

## 2014-02-24 LAB — COMPREHENSIVE METABOLIC PANEL (CC13)
ALK PHOS: 53 U/L (ref 40–150)
ALT: 24 U/L (ref 0–55)
AST: 26 U/L (ref 5–34)
Albumin: 3.7 g/dL (ref 3.5–5.0)
Anion Gap: 9 mEq/L (ref 3–11)
BILIRUBIN TOTAL: 0.8 mg/dL (ref 0.20–1.20)
BUN: 11.1 mg/dL (ref 7.0–26.0)
CO2: 26 mEq/L (ref 22–29)
Calcium: 9.5 mg/dL (ref 8.4–10.4)
Chloride: 107 mEq/L (ref 98–109)
Creatinine: 1 mg/dL (ref 0.6–1.1)
Glucose: 93 mg/dl (ref 70–140)
Potassium: 3.8 mEq/L (ref 3.5–5.1)
SODIUM: 143 meq/L (ref 136–145)
TOTAL PROTEIN: 7.1 g/dL (ref 6.4–8.3)

## 2014-03-02 ENCOUNTER — Ambulatory Visit: Payer: 59 | Admitting: Physician Assistant

## 2014-03-02 ENCOUNTER — Other Ambulatory Visit: Payer: 59

## 2014-03-03 ENCOUNTER — Telehealth: Payer: Self-pay | Admitting: *Deleted

## 2014-03-03 ENCOUNTER — Other Ambulatory Visit: Payer: 59

## 2014-03-03 ENCOUNTER — Ambulatory Visit: Payer: 59 | Admitting: Physician Assistant

## 2014-03-03 ENCOUNTER — Encounter: Payer: Self-pay | Admitting: Physician Assistant

## 2014-03-03 ENCOUNTER — Telehealth: Payer: Self-pay | Admitting: Oncology

## 2014-03-03 ENCOUNTER — Ambulatory Visit (HOSPITAL_BASED_OUTPATIENT_CLINIC_OR_DEPARTMENT_OTHER): Payer: 59 | Admitting: Physician Assistant

## 2014-03-03 VITALS — BP 126/85 | HR 92 | Temp 97.7°F | Resp 18 | Ht 62.0 in | Wt 144.3 lb

## 2014-03-03 DIAGNOSIS — M949 Disorder of cartilage, unspecified: Secondary | ICD-10-CM

## 2014-03-03 DIAGNOSIS — C50911 Malignant neoplasm of unspecified site of right female breast: Secondary | ICD-10-CM

## 2014-03-03 DIAGNOSIS — C50212 Malignant neoplasm of upper-inner quadrant of left female breast: Secondary | ICD-10-CM

## 2014-03-03 DIAGNOSIS — Z170 Estrogen receptor positive status [ER+]: Secondary | ICD-10-CM

## 2014-03-03 DIAGNOSIS — Z853 Personal history of malignant neoplasm of breast: Secondary | ICD-10-CM

## 2014-03-03 DIAGNOSIS — M899 Disorder of bone, unspecified: Secondary | ICD-10-CM

## 2014-03-03 DIAGNOSIS — C50419 Malignant neoplasm of upper-outer quadrant of unspecified female breast: Secondary | ICD-10-CM

## 2014-03-03 DIAGNOSIS — M8580 Other specified disorders of bone density and structure, unspecified site: Secondary | ICD-10-CM

## 2014-03-03 DIAGNOSIS — K219 Gastro-esophageal reflux disease without esophagitis: Secondary | ICD-10-CM | POA: Insufficient documentation

## 2014-03-03 MED ORDER — ANASTROZOLE 1 MG PO TABS: 1.0000 mg | ORAL_TABLET | Freq: Every day | ORAL | Status: DC

## 2014-03-03 NOTE — Progress Notes (Signed)
ID: Caroline Mejia   DOB: 01-23-1956  MR#: [id number]  WUJ#:[id number]  PCP: Donnie Coffin, MD GYN:  SU: Fanny Skates OTHER MD: Crissie Reese, Tyler Pita  CHIEF COMPLAINT:  Bilateral Breast Cancers (anastrazole)     HISTORY OF PRESENT ILLNESS: Caroline Mejia was followed by my former partner, [name], for a right-sided breast cancer dating back to March of 2006. I have summarized that data below. More recently, routine screening mammography 11/25/2012 at the breast Center showed a possible mass in the left breast. Diagnostic left mammogram and ultrasonography 12/05/2012 showed a persistent density in the upper inner left breast, which was not palpable by exam. Ultrasound confirmed a vertically oriented hypoechoic mass measuring 0.9 cm. The left axilla was benign.  Biopsy of the left breast mass 12/11/2012 showed ([id number]) an invasive ductal carcinoma, low-grade, estrogen and progesterone receptors both positive at 100%, with an MIB-1 of 8%, and no HER-2 amplification by CISH. On 01/28/2013 the patient underwent left mastectomy with sentinel lymph node sampling. The final pathology ([id number]) showed an invasive ductal carcinoma, grade 2, maximal size 0.9 cm, with both sentinel lymph nodes negative, and ample margins.  The patient's subsequent history Caroline Mejia as detailed below.  INTERVAL HISTORY: Caroline Mejia returns alone today for followup of her bilateral breast carcinomas. She continues on anastrozole which she Caroline Mejia tolerating well. She has no significant hot flashes and denies any vaginal dryness or increased joint pain. She did experience some increase in reflux-type symptoms a couple of months ago and wondered if that was associated with the drug. She increased her omeprazole from 20 mg daily to 20 mg twice daily, and the symptoms have improved significantly. Accordingly, she has continued on anastrozole.   Otherwise, Caroline Mejia also has a history of osteopenia and received her first  annual infusion of zoledronic acid on a 2614. She tolerated the procedure well, and denies any jaw pain or dental problems. Her last dental exam was in March 2015 and was "okay".   Although she admits she Caroline Mejia not exercising on a regular basis, Caroline Mejia Caroline Mejia keeping herself busy. She has 6 grandchildren, ranging in age from to 2 years to 59 years old, and all living nearby.    REVIEW OF SYSTEMS: Caroline Mejia denies any recent illnesses and has had no fevers, chills, or night sweats. She's had no skin changes, abnormal bruising, or bleeding. Her energy level Caroline Mejia good. Her appetite Caroline Mejia fair. She denies any actual nausea or emesis and has had no change in bowel or bladder habits. As noted above, she still has reflux but this has improved. [name] denies any increased cough, shortness of breath, peripheral swelling, chest pain, or palpitations. She's had no abnormal headaches or dizziness and currently denies any unusual myalgias, arthralgias, or bony pain.  A detailed review of systems Caroline Mejia otherwise stable and noncontributory. Caroline Mejia    PAST MEDICAL HISTORY: Past Medical History  Diagnosis Date  . GERD (gastroesophageal reflux disease)   . H/O hiatal hernia   . Breast cancer     bilaterally; "have had chemo & radiation on right side only"  (01/28/2013)    PAST SURGICAL HISTORY: Past Surgical History  Procedure Laterality Date  . Tubal ligation  1983  . Reconstruction breast w/ tram flap Right 2008  . Breast reduction surgery Left 2009  . Breast lumpectomy Right 2006  . Breast surgery    . Breast biopsy Right 2006; 2008  . Breast biopsy Left 12/2012  . Mastectomy Right 2008  . Mastectomy Left 01/28/2013  .  Reconstruction breast w/ latissimus dorsi flap Left 01/28/2013  . Mastectomy w/ sentinel node biopsy Left 01/28/2013    Procedure: LEFT TOTAL MASTECTOMY WITH SENTINEL LYMPH NODE BIOPSY;  Surgeon: Adin Hector, MD;  Location: [facility];  Service: General;  Laterality: Left;  . Latissimus flap to breast  Left 01/28/2013    Procedure: LEFT LATISSIMUS FLAP TO BREAST WITH SALINE IMPLANT;  Surgeon: Crissie Reese, MD;  Location: [facility];  Service: Plastics;  Laterality: Left;    FAMILY HISTORY Family History  Problem Relation Age of Onset  . Heart disease Mother   . Heart disease Father   . Cancer Father    the patient's father died from congestive heart failure at the age of 20. The patient's mother Caroline Mejia currently 44 years old. The patient has one brother, 2 sisters. There Caroline Mejia no history of breast or ovarian cancer in the family.  GYNECOLOGIC HISTORY:  (reviewed 03/03/2014)  Menarche age 1, first live birth age 5. The patient Caroline Mejia GX P2. She went through menopause with her first chemotherapy, in early 2006. She never took hormone replacement.  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 She Caroline Mejia divorced. At home she lives with her mother. They share a cat. Daughter [name] works as a Scientist, clinical (histocompatibility and immunogenetics) for a [employer]. Son [name] works as a Government social research officer in [employer]. The patient has 6 grandchildren. She attends [employer] DIRECTIVES: Not in place but were discussed with the patient may want 2014. She states she has the papers and Caroline Mejia intending to complete dense.  HEALTH MAINTENANCE:  (updated 03/03/2014)  History  Substance Use Topics  . Smoking status: Never Smoker   . Smokeless tobacco: Never Used  . Alcohol Use: 0.0 oz/week     Comment: 01/28/2013 "glass of wine ~ once/month"     Colonoscopy: 2008/ [facility]  PAP:  August 2014/[facility]   Bone density:  July 2014, osteopenia with a T score of -2.1   Lipid panel: not on file/[facility]  No Known Allergies  Current Outpatient Prescriptions  Medication Sig Dispense Refill  . anastrozole (ARIMIDEX) 1 MG tablet Take 1 tablet (1 mg total) by mouth daily.  90 tablet  3  . calcium carbonate (OS-CAL) 600 MG TABS Take 600 mg by mouth 2 (two) times daily with a meal.        .  cycloSPORINE (RESTASIS) 0.05 % ophthalmic emulsion Place 1 drop into both eyes 2 (two) times daily.      Marland Kitchen omeprazole (PRILOSEC) 20 MG capsule Take 20 mg by mouth 2 (two) times daily before a meal.       . simvastatin (ZOCOR) 40 MG tablet Take 40 mg by mouth at bedtime.        . docusate sodium 100 MG CAPS Take 100 mg by mouth daily.  30 capsule  0   No current facility-administered medications for this visit.    OBJECTIVE: Middle-aged white woman who appears well Filed Vitals:   03/03/14 1353  BP: 126/85  Pulse: 92  Temp: 97.7 F (36.5 C)  Resp: 18  Body mass index Caroline Mejia 26.39 kg/(m^2).  ECOG:  0 Filed Weights   03/03/14 1353  Weight: 144 lb 4.8 oz (65.454 kg)   Physical Exam: HEENT:  Sclerae anicteric. Good dentition. Oropharynx clear, pink, and moist. Neck supple, trachea midline. No thyromegaly.  NODES:  No cervical or supraclavicular lymphadenopathy palpated.  BREAST EXAM:  Patient Caroline Mejia status post bilateral  mastectomies with bilateral reconstruction, TRAM reconstruction on the right, and latissimus flap reconstruction on the left. There no suspicious nodularities or skin lesions noted. No evidence of local recurrence. Axillae are benign bilaterally no palpable lymphadenopathy. LUNGS:  Clear to auscultation bilaterally.  No wheezes or rhonchi HEART:  Regular rate and rhythm. No murmur  ABDOMEN:  Soft, nontender. No organomegaly or masses palpated. Positive bowel sounds.  MSK:  No focal spinal tenderness to palpation. Good range of motion bilaterally in the upper extremities. EXTREMITIES:  No peripheral edema.  No lymphedema noted in the upper extremities. SKIN: No visible rashes. No excessive ecchymoses or petechiae. No pallor. NEURO:  Nonfocal. Well oriented.  Positive affect.       LAB RESULTS: Lab Results  Component Value Date   WBC 5.8 02/24/2014   NEUTROABS 2.4 02/24/2014   HGB 15.1 02/24/2014   HCT 44.6 02/24/2014   MCV 86.9 02/24/2014   PLT 270 02/24/2014       Chemistry      Component Value Date/Time   NA 143 02/24/2014 1458   NA 143 01/21/2013 0829   K 3.8 02/24/2014 1458   K 4.2 01/21/2013 0829   CL 106 02/06/2013 1605   CL 106 01/21/2013 0829   CO2 26 02/24/2014 1458   CO2 29 01/21/2013 0829   BUN 11.1 02/24/2014 1458   BUN 9 01/21/2013 0829   CREATININE 1.0 02/24/2014 1458   CREATININE 0.92 01/21/2013 0829      Component Value Date/Time   CALCIUM 9.5 02/24/2014 1458   CALCIUM 9.9 01/21/2013 0829   ALKPHOS 53 02/24/2014 1458   ALKPHOS 61 01/21/2013 0829   AST 26 02/24/2014 1458   AST 25 01/21/2013 0829   ALT 24 02/24/2014 1458   ALT 19 01/21/2013 0829   BILITOT 0.80 02/24/2014 1458   BILITOT 0.9 01/21/2013 0829      STUDIES:  Bone density 04/29/2013 at the breast Center shows a T score of -2.1 at the left femoral neck.     ASSESSMENT: 58 y.o. BRCA negative [place] woman  (1) status post right breast biopsy 12/07/2004 for a clinical T2 N0, stage IIA invasive ductal carcinoma, grade 3, initially read as triple negative  (2) treated neoadjuvantly with cyclophosphamide and doxorubicin in dose dense fashion x2, followed by fluorouracil, epirubicin and cyclophosphamide in dose dense fashion x2, followed by Taxotere x4, completed 04/20/2005.  (3) status post right mastectomy with sentinel lymph node sampling 05/11/2005 for a residual pT1c pN0 invasive ductal carcinoma, grade 3, again estrogen and progesterone receptor negative, with an MIB-1 of 52%, but HER-2 amplified by FISH with a ratio of 3.75  (4) adjuvant radiation completed 08/02/2005  (5) recurrence to the right breast documented February of 2008, status post right mastectomy 12/27/2006 for a pT1b NX invasive ductal carcinoma, again estrogen and progesterone receptor negative, with an MIB-1 of 64%, and HER-2 amplified by fish with a ratio of 3.58.  (6) adjuvant chemotherapy with weekly carboplatin (x7), Abraxane (x9)  and trastuzumab completed 05/08/2007. Trastuzumab was continued to  May 2009, to total one year  (7) left mastectomy with sentinel lymph node sampling 01/28/2013 for a pT1c pN0, stage IA invasive ductal carcinoma, grade 1, estrogen and progesterone receptor both 100% positive, with an MIB-1 of 8%, and no HER-2 amplification  (8) status post remote TRAM flap reconstruction on the right, latissimus flap reconstruction on the left  (9) started anastrozole, August 2014, the goal being to continue for a total of 5 years  (  10) osteopenia, being treated with zoledronic acid, first dose on 06/03/2013   PLAN: Yumi  appears to be doing well with regards to her history of breast cancer. She will continue on the anastrozole which I have refilled for another year. Our plan Caroline Mejia to continue for total of 5 years, until August 2019.   In the meanwhile, we will continue to follow her bone density, with her next bone density due in July of 2016. I encouraged her to walk regularly to help build bone mass. She will continue to receive zoledronic acid annually, and her next infusion will be scheduled for August 2015. She will then see [place] 3 months later in late November or early December 2015. If all Caroline Mejia well at that point, we will likely then see her the following August 2016, just after her bone density, and in time for her annual dose of zoledronic acid. At that point, we will likely begin following her on an annual basis.  All of the above Caroline Mejia reviewed in detail with Belenda Cruise today. She'll continue to be followed by her primary care physician, Dr. Alroy Dust, for additional health concerns, including her increased reflux. In fact, she tells me she Caroline Mejia scheduled to see him later this week.  She voices both her understanding and agreement with our plan and will call with any changes or problems.   Taisia Fantini Milda Smart PA-C   03/03/2014

## 2014-03-03 NOTE — Telephone Encounter (Signed)
Per staff message and POF I have scheduled appts.  JMW  

## 2014-03-03 NOTE — Telephone Encounter (Signed)
sch pt appt-sent email to MW to sch trmt-adv pt willcall her with the trmt time & date-pt undertood

## 2014-05-15 ENCOUNTER — Other Ambulatory Visit: Payer: Self-pay | Admitting: Oncology

## 2014-05-15 ENCOUNTER — Ambulatory Visit (HOSPITAL_BASED_OUTPATIENT_CLINIC_OR_DEPARTMENT_OTHER): Payer: 59

## 2014-05-15 VITALS — BP 125/87 | HR 87 | Temp 98.3°F | Resp 18

## 2014-05-15 DIAGNOSIS — M8580 Other specified disorders of bone density and structure, unspecified site: Secondary | ICD-10-CM

## 2014-05-15 DIAGNOSIS — M949 Disorder of cartilage, unspecified: Secondary | ICD-10-CM

## 2014-05-15 DIAGNOSIS — M899 Disorder of bone, unspecified: Secondary | ICD-10-CM

## 2014-05-15 MED ORDER — SODIUM CHLORIDE 0.9 % IV SOLN
INTRAVENOUS | Status: DC
  Administered 2014-05-15: 09:00:00 via INTRAVENOUS

## 2014-05-15 MED ORDER — SODIUM CHLORIDE 0.9 % IV SOLN
4.0000 mg | Freq: Once | INTRAVENOUS | Status: AC
  Administered 2014-05-15: 4 mg via INTRAVENOUS
  Filled 2014-05-15: qty 5

## 2014-05-15 NOTE — Progress Notes (Signed)
Patient denies any dental problems.

## 2014-05-15 NOTE — Patient Instructions (Signed)
[facility] Discharge Instructions for Patients Today you received the following agents Zometa.  Please keep your regularly scheduled appointments.    If you develop any of the following symptoms and they are not resolved by prescribed medication, call the clinic.   BELOW ARE SYMPTOMS THAT SHOULD BE REPORTED IMMEDIATELY:  *FEVER GREATER THAN 100.5 F  *CHILLS WITH OR WITHOUT FEVER  NAUSEA AND VOMITING THAT IS NOT CONTROLLED WITH YOUR NAUSEA MEDICATION  *UNUSUAL SHORTNESS OF BREATH  *UNUSUAL BRUISING OR BLEEDING  TENDERNESS IN MOUTH AND THROAT WITH OR WITHOUT PRESENCE OF ULCERS  *URINARY PROBLEMS  *BOWEL PROBLEMS  UNUSUAL RASH Items with * indicate a potential emergency and should be followed up as soon as possible.  Feel free to call the clinic you have any questions or concerns. The clinic phone number is [phone].     Zoledronic Acid injection (Hypercalcemia, Oncology) What is this medicine? ZOLEDRONIC ACID (ZOE le dron ik AS id) lowers the amount of calcium loss from bone. It is used to treat too much calcium in your blood from cancer. It is also used to prevent complications of cancer that has spread to the bone. This medicine may be used for other purposes; ask your health care provider or pharmacist if you have questions. What should I tell my health care provider before I take this medicine? They need to know if you have any of these conditions: -aspirin-sensitive asthma -dental disease -kidney disease -an unusual or allergic reaction to zoledronic acid, other medicines, foods, dyes, or preservatives -pregnant or trying to get pregnant -breast-feeding How should I use this medicine? This medicine is for infusion into a vein. It is given by a health care professional in a hospital or clinic setting. Talk to your pediatrician regarding the use of this medicine in children. Special care may be needed. Overdosage: If you think you have taken too  much of this medicine contact a poison control center or emergency room at once. NOTE: This medicine is only for you. Do not share this medicine with others. What if I miss a dose? It is important not to miss your dose. Call your doctor or health care professional if you are unable to keep an appointment. What may interact with this medicine? -certain antibiotics given by injection -NSAIDs, medicines for pain and inflammation, like ibuprofen or naproxen -some diuretics like bumetanide, furosemide -teriparatide -thalidomide This list may not describe all possible interactions. Give your health care provider a list of all the medicines, herbs, non-prescription drugs, or dietary supplements you use. Also tell them if you smoke, drink alcohol, or use illegal drugs. Some items may interact with your medicine. What should I watch for while using this medicine? Visit your doctor or health care professional for regular checkups. It may be some time before you see the benefit from this medicine. Do not stop taking your medicine unless your doctor tells you to. Your doctor may order blood tests or other tests to see how you are doing. Women should inform their doctor if they wish to become pregnant or think they might be pregnant. There is a potential for serious side effects to an unborn child. Talk to your health care professional or pharmacist for more information. You should make sure that you get enough calcium and vitamin D while you are taking this medicine. Discuss the foods you eat and the vitamins you take with your health care professional. Some people who take this medicine have severe bone, joint, and/or   muscle pain. This medicine may also increase your risk for a broken thigh bone. Tell your doctor right away if you have pain in your upper leg or groin. Tell your doctor if you have any pain that does not go away or that gets worse. What side effects may I notice from receiving this  medicine? Side effects that you should report to your doctor or health care professional as soon as possible: -allergic reactions like skin rash, itching or hives, swelling of the face, lips, or tongue -anxiety, confusion, or depression -breathing problems -changes in vision -feeling faint or lightheaded, falls -jaw burning, cramping, pain -muscle cramps, stiffness, or weakness -trouble passing urine or change in the amount of urine Side effects that usually do not require medical attention (report to your doctor or health care professional if they continue or are bothersome): -bone, joint, or muscle pain -fever -hair loss -irritation at site where injected -loss of appetite -nausea, vomiting -stomach upset -tired This list may not describe all possible side effects. Call your doctor for medical advice about side effects. You may report side effects to FDA at 1-800-FDA-1088. Where should I keep my medicine? This drug is given in a hospital or clinic and will not be stored at home. NOTE: This sheet is a summary. It may not cover all possible information. If you have questions about this medicine, talk to your doctor, pharmacist, or health care provider.  2013, [employer] Standard. (03/24/2011 9:06:58 AM)  

## 2014-06-16 ENCOUNTER — Other Ambulatory Visit: Payer: Self-pay | Admitting: Gastroenterology

## 2014-06-25 ENCOUNTER — Other Ambulatory Visit: Payer: Self-pay | Admitting: Gastroenterology

## 2014-06-25 DIAGNOSIS — R112 Nausea with vomiting, unspecified: Secondary | ICD-10-CM

## 2014-06-26 ENCOUNTER — Telehealth: Payer: Self-pay

## 2014-06-26 NOTE — Telephone Encounter (Signed)
report rcvd from [facility] dtd 06/22/14.  Sent to scan.   Copy to gudena

## 2014-07-02 ENCOUNTER — Ambulatory Visit
Admission: RE | Admit: 2014-07-02 | Discharge: 2014-07-02 | Disposition: A | Discharge status: Home / Self Care | Payer: 59 | Source: Ambulatory Visit | Attending: Gastroenterology | Admitting: Gastroenterology

## 2014-07-02 DIAGNOSIS — R112 Nausea with vomiting, unspecified: Secondary | ICD-10-CM

## 2014-09-10 ENCOUNTER — Other Ambulatory Visit (HOSPITAL_BASED_OUTPATIENT_CLINIC_OR_DEPARTMENT_OTHER): Payer: 59

## 2014-09-10 ENCOUNTER — Other Ambulatory Visit: Payer: Self-pay | Admitting: Emergency Medicine

## 2014-09-10 DIAGNOSIS — C50212 Malignant neoplasm of upper-inner quadrant of left female breast: Secondary | ICD-10-CM

## 2014-09-10 DIAGNOSIS — C50419 Malignant neoplasm of upper-outer quadrant of unspecified female breast: Secondary | ICD-10-CM

## 2014-09-10 DIAGNOSIS — C50911 Malignant neoplasm of unspecified site of right female breast: Secondary | ICD-10-CM

## 2014-09-10 LAB — CBC WITH DIFFERENTIAL/PLATELET
BASO%: 0.6 % (ref 0.0–2.0)
Basophils Absolute: 0 10*3/uL (ref 0.0–0.1)
EOS%: 0.9 % (ref 0.0–7.0)
Eosinophils Absolute: 0.1 10*3/uL (ref 0.0–0.5)
HCT: 47.4 % — ABNORMAL HIGH (ref 34.8–46.6)
HGB: 15.2 g/dL (ref 11.6–15.9)
LYMPH%: 44.3 % (ref 14.0–49.7)
MCH: 28.9 pg (ref 25.1–34.0)
MCHC: 32 g/dL (ref 31.5–36.0)
MCV: 90.5 fL (ref 79.5–101.0)
MONO#: 0.6 10*3/uL (ref 0.1–0.9)
MONO%: 8.7 % (ref 0.0–14.0)
NEUT#: 3.2 10*3/uL (ref 1.5–6.5)
NEUT%: 45.5 % (ref 38.4–76.8)
Platelets: 279 10*3/uL (ref 145–400)
RBC: 5.24 10*6/uL (ref 3.70–5.45)
RDW: 13.1 % (ref 11.2–14.5)
WBC: 7.1 10*3/uL (ref 3.9–10.3)
lymph#: 3.1 10*3/uL (ref 0.9–3.3)

## 2014-09-10 LAB — COMPREHENSIVE METABOLIC PANEL (CC13)
ALBUMIN: 3.9 g/dL (ref 3.5–5.0)
ALT: 21 U/L (ref 0–55)
ANION GAP: 10 meq/L (ref 3–11)
AST: 23 U/L (ref 5–34)
Alkaline Phosphatase: 62 U/L (ref 40–150)
BUN: 12.6 mg/dL (ref 7.0–26.0)
CO2: 28 meq/L (ref 22–29)
Calcium: 9.8 mg/dL (ref 8.4–10.4)
Chloride: 105 mEq/L (ref 98–109)
Creatinine: 1 mg/dL (ref 0.6–1.1)
EGFR: 63 mL/min/{1.73_m2} — AB (ref >90)
GLUCOSE: 73 mg/dL (ref 70–140)
POTASSIUM: 3.8 meq/L (ref 3.5–5.1)
SODIUM: 142 meq/L (ref 136–145)
TOTAL PROTEIN: 7 g/dL (ref 6.4–8.3)
Total Bilirubin: 0.64 mg/dL (ref 0.20–1.20)

## 2014-09-17 ENCOUNTER — Ambulatory Visit (HOSPITAL_BASED_OUTPATIENT_CLINIC_OR_DEPARTMENT_OTHER): Payer: 59 | Admitting: Oncology

## 2014-09-17 ENCOUNTER — Telehealth: Payer: Self-pay | Admitting: Oncology

## 2014-09-17 VITALS — BP 118/82 | HR 90 | Temp 97.8°F | Resp 18 | Ht 62.0 in | Wt 139.0 lb

## 2014-09-17 DIAGNOSIS — Z853 Personal history of malignant neoplasm of breast: Secondary | ICD-10-CM

## 2014-09-17 DIAGNOSIS — C50212 Malignant neoplasm of upper-inner quadrant of left female breast: Secondary | ICD-10-CM

## 2014-09-17 DIAGNOSIS — C50911 Malignant neoplasm of unspecified site of right female breast: Secondary | ICD-10-CM

## 2014-09-17 NOTE — Progress Notes (Signed)
ID: Caroline Mejia   DOB: Apr 21, 1956  MR#: [id number]  YME#:[id number]  PCP: Donnie Coffin, MD GYN:  SU: Fanny Skates OTHER MD: Crissie Reese, Tyler Pita, Teena Irani  CHIEF COMPLAINT:  Bilateral Breast Cancers  CURRENT TREATMENT: Anastrozole   HISTORY OF PRESENT ILLNESS: From the earlier treatment summary. Collyns was followed by my former partner, [name], for a right-sided breast cancer dating back to March of 2006. I have summarized that data below. More recently, routine screening mammography 11/25/2012 at the [facility] Center showed a possible mass in the left breast. Diagnostic left mammogram and ultrasonography 12/05/2012 showed a persistent density in the upper inner left breast, which was not palpable by exam. Ultrasound confirmed a vertically oriented hypoechoic mass measuring 0.9 cm. The left axilla was benign.  Biopsy of the left breast mass 12/11/2012 showed ([id number]) an invasive ductal carcinoma, low-grade, estrogen and progesterone receptors both positive at 100%, with an MIB-1 of 8%, and no HER-2 amplification by C[facility]. On 01/28/2013 the patient underwent left mastectomy with sentinel lymph node sampling. The final pathology ([id number]) showed an invasive ductal carcinoma, grade 2, maximal size 0.9 cm, with both sentinel lymph nodes negative, and ample margins.  The patient's subsequent history is as detailed below.  INTERVAL HISTORY: Caroline Mejia returns today for follow-up of her breast cancer. The interval history is generally stable. She continues on anastrozole. She gets occasional hot flashes and then feels chilly. Vaginal dryness is not a concern. Otherwise she is tolerating anastrozole with no side effects that she is aware of.  REVIEW OF SYSTEMS: [name] was having significant gastritis and reflux problems but has been started on Prilosec 40 twice daily and that has helped quite a bit she says. She got herself a "fit bit" and does about 12-4998 steps at  work, and then tries to walk another 30 minutes. A detailed review of systems today was otherwise entirely negative.    PAST MEDICAL HISTORY: Past Medical History  Diagnosis Date  . GERD (gastroesophageal reflux disease)   . H/O hiatal hernia   . Breast cancer     bilaterally; "have had chemo & radiation on right side only"  (01/28/2013)    PAST SURGICAL HISTORY: Past Surgical History  Procedure Laterality Date  . Tubal ligation  1983  . Reconstruction breast w/ tram flap Right 2008  . Breast reduction surgery Left 2009  . Breast lumpectomy Right 2006  . Breast surgery    . Breast biopsy Right 2006; 2008  . Breast biopsy Left 12/2012  . Mastectomy Right 2008  . Mastectomy Left 01/28/2013  . Reconstruction breast w/ latissimus dorsi flap Left 01/28/2013  . Mastectomy w/ sentinel node biopsy Left 01/28/2013    Procedure: LEFT TOTAL MASTECTOMY WITH SENTINEL LYMPH NODE BIOPSY;  Surgeon: Adin Hector, MD;  Location: [facility];  Service: General;  Laterality: Left;  . Latissimus flap to breast Left 01/28/2013    Procedure: LEFT LATISSIMUS FLAP TO BREAST WITH SALINE IMPLANT;  Surgeon: Crissie Reese, MD;  Location: [facility];  Service: Plastics;  Laterality: Left;    FAMILY HISTORY Family History  Problem Relation Age of Onset  . Heart disease Mother   . Heart disease Father   . Cancer Father    the patient's father died from congestive heart failure at the age of 80. The patient's mother is currently 27 years old. The patient has one brother, 2 sisters. There is no history of breast or ovarian cancer in the family.  GYNECOLOGIC HISTORY:  (  reviewed 03/03/2014)  Menarche age 40, first live birth age 45. The patient is GX P2. She went through menopause with her first chemotherapy, in early 2006. She never took hormone replacement.  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 At home she lives with her 28 y/o mother. They share a cat. Daughter  Caroline Mejia works as a Scientist, clinical (histocompatibility and immunogenetics) for a [employer]. Son Caroline Mejia works as a Government social research officer in [place]. He is about to have his fourth child. The patient has 6 grandchildren. The oldest will be going to [employer] fall of 2016 She attends [employer]: Not in place but were discussed with the patient May 2014. She is considering name in her daughter as healthcare power of attorney  HEALTH MAINTENANCE:  (updated 03/03/2014)  History  Substance Use Topics  . Smoking status: Never Smoker   . Smokeless tobacco: Never Used  . Alcohol Use: 0.0 oz/week     Comment: 01/28/2013 "glass of wine ~ once/month"     Colonoscopy: 2008/ [facility]  PAP:  August 2014/[facility]   Bone density:  July 2014, osteopenia with a T score of -2.1   Lipid panel: not on file/[facility]  No Known Allergies  Current Outpatient Prescriptions  Medication Sig Dispense Refill  . anastrozole (ARIMIDEX) 1 MG tablet Take 1 tablet (1 mg total) by mouth daily. 90 tablet 3  . calcium carbonate (OS-CAL) 600 MG TABS Take 600 mg by mouth 2 (two) times daily with a meal.      . cycloSPORINE (RESTASIS) 0.05 % ophthalmic emulsion Place 1 drop into both eyes 2 (two) times daily.    Marland Kitchen docusate sodium 100 MG CAPS Take 100 mg by mouth daily. 30 capsule 0  . omeprazole (PRILOSEC) 20 MG capsule Take 20 mg by mouth 2 (two) times daily before a meal.     . simvastatin (ZOCOR) 40 MG tablet Take 40 mg by mouth at bedtime.       No current facility-administered medications for this visit.    OBJECTIVE: Middle-aged white woman in no acute distress Filed Vitals:   09/17/14 1325  BP: 118/82  Pulse: 90  Temp: 97.8 F (36.6 C)  Resp: 18  Body mass index is 25.42 kg/(m^2).  ECOG:  0 Filed Weights   09/17/14 1325  Weight: 139 lb (63.05 kg)   Sclerae unicteric, pupils equal and reactive Oropharynx clear, teeth in good repair No cervical or supraclavicular adenopathy Lungs no  rales or rhonchi Heart regular rate and rhythm Abd soft, nontender, positive bowel sounds MSK no focal spinal tenderness, no upper extremity lymphedema Neuro: nonfocal, well oriented, appropriate affect Breasts: Status post bilateral mastectomies with bilateral flap reconstruction. There is no evidence of local recurrence. Both axillae are benign.   LAB RESULTS: Lab Results  Component Value Date   WBC 7.1 09/10/2014   NEUTROABS 3.2 09/10/2014   HGB 15.2 09/10/2014   HCT 47.4* 09/10/2014   MCV 90.5 09/10/2014   PLT 279 09/10/2014      Chemistry      Component Value Date/Time   NA 142 09/10/2014 1632   NA 143 01/21/2013 0829   K 3.8 09/10/2014 1632   K 4.2 01/21/2013 0829   CL 106 02/06/2013 1605   CL 106 01/21/2013 0829   CO2 28 09/10/2014 1632   CO2 29 01/21/2013 0829   BUN 12.6 09/10/2014 1632   BUN 9 01/21/2013 0829   CREATININE  1.0 09/10/2014 1632   CREATININE 0.92 01/21/2013 0829      Component Value Date/Time   CALCIUM 9.8 09/10/2014 1632   CALCIUM 9.9 01/21/2013 0829   ALKPHOS 62 09/10/2014 1632   ALKPHOS 61 01/21/2013 0829   AST 23 09/10/2014 1632   AST 25 01/21/2013 0829   ALT 21 09/10/2014 1632   ALT 19 01/21/2013 0829   BILITOT 0.64 09/10/2014 1632   BILITOT 0.9 01/21/2013 0829      STUDIES:  CLINICAL DATA: Increasing nausea and vomiting. Known hiatal hernia. History of breast cancer.  EXAM: UPPER GI SERIES WITH KUB  TECHNIQUE: After obtaining a scout radiograph a routine upper GI series was performed using thin and high density barium.  FLUOROSCOPY TIME: 2 min and 54 seconds of pulsed fluoro. Images were acquired with fluoro store mechanism to minimize radiation.  COMPARISON: CTs of the chest, abdomen and pelvis 12/22/2009.  FINDINGS: The scout abdominal radiograph demonstrates multiple surgical clips and moderate stool throughout the colon. There is a large hiatal hernia.  The esophageal motility is normal. There is a large  hiatal hernia which is relatively fixed in size during this examination. No esophageal stricture or mucosal ulceration is identified. No mucosal abnormalities are identified within the stomach or duodenum. The duodenum appears normal. There is no gastric outlet obstruction.  No significant reflux was elicited with the water siphon test. A 13 mm barium tablet was administered and passed without delay into the stomach.  IMPRESSION: 1. Large hiatal hernia. No paraesophageal component identified. 2. No evidence of mucosal ulceration or stricture.   Electronically Signed  By: Camie Patience M.D.  On: 07/02/2014 08:53    ASSESSMENT: 58 y.o. BRCA negative [place] woman  (1) status post right breast biopsy 12/07/2004 for a clinical T2 N0, stage IIA invasive ductal carcinoma, grade 3, initially read as triple negative  (2) treated neoadjuvantly with cyclophosphamide and doxorubicin in dose dense fashion x2, followed by fluorouracil, epirubicin and cyclophosphamide in dose dense fashion x2, followed by Taxotere x4, completed 04/20/2005.  (3) status post right mastectomy with sentinel lymph node sampling 05/11/2005 for a residual pT1c pN0 invasive ductal carcinoma, grade 3, again estrogen and progesterone receptor negative, with an MIB-1 of 52%, but HER-2 amplified by FISH with a ratio of 3.75  (4) adjuvant radiation completed 08/02/2005  (5) recurrence to the right breast documented February of 2008, status post right mastectomy 12/27/2006 for a pT1b NX invasive ductal carcinoma, again estrogen and progesterone receptor negative, with an MIB-1 of 64%, and HER-2 amplified by fish with a ratio of 3.58.  (6) adjuvant chemotherapy with weekly carboplatin (x7), Abraxane (x9)  and trastuzumab completed 05/08/2007. Trastuzumab was continued to May 2009, to total one year  (7) left mastectomy with sentinel lymph node sampling 01/28/2013 for a pT1c pN0, stage IA invasive ductal carcinoma,  grade 1, estrogen and progesterone receptor both 100% positive, with an MIB-1 of 8%, and no HER-2 amplification  (8) status post remote TRAM flap reconstruction on the right, latissimus flap reconstruction on the left  (9) started anastrozole, August 2014, the goal being to continue for a total of 5 years  (a)  Osteopenia, with bone density 04/29/2013 showing a lowest T score of -2.1  (b) started zoledronic acid 06/03/2013, to be repeated yearly while on anastrozole   PLAN: Anaalicia is doing fine from a breast cancer point of view, with no evidence of disease recurrence or activity now a year and a half out from her definitive surgery.  The plan is to continue anastrozole for total of 5 years. Hot flashes are minor issue for her and she is not interested in our "intimacy and pelvic health" program.  She will see [place] again in may of 2016. From that visit on we will start seeing her on a once a year basis. She will need to be set up for August 2016 zolendronate and her July 2016 bone density test at the next visit  She has a good understanding of the overall plan. She agrees with it. She knows the goal of treatment in her case is cure. She will call with any problems that may develop before her next visit here.  Chauncey Cruel, MD   09/17/2014

## 2014-09-17 NOTE — Telephone Encounter (Signed)
per pof to sch pt appt-gave pt copy of sch °

## 2014-11-18 ENCOUNTER — Emergency Department (HOSPITAL_COMMUNITY): Payer: 59

## 2014-11-18 ENCOUNTER — Inpatient Hospital Stay (HOSPITAL_COMMUNITY)
Admission: EM | Admit: 2014-11-18 | Discharge: 2014-11-25 | DRG: 328 | Disposition: A | Discharge status: Home / Self Care | Payer: 59 | Attending: General Surgery | Admitting: General Surgery

## 2014-11-18 ENCOUNTER — Encounter (HOSPITAL_COMMUNITY): Payer: Self-pay | Admitting: Emergency Medicine

## 2014-11-18 DIAGNOSIS — M8580 Other specified disorders of bone density and structure, unspecified site: Secondary | ICD-10-CM | POA: Diagnosis present

## 2014-11-18 DIAGNOSIS — K219 Gastro-esophageal reflux disease without esophagitis: Secondary | ICD-10-CM | POA: Diagnosis present

## 2014-11-18 DIAGNOSIS — Z9221 Personal history of antineoplastic chemotherapy: Secondary | ICD-10-CM

## 2014-11-18 DIAGNOSIS — K449 Diaphragmatic hernia without obstruction or gangrene: Secondary | ICD-10-CM

## 2014-11-18 DIAGNOSIS — Z9889 Other specified postprocedural states: Secondary | ICD-10-CM

## 2014-11-18 DIAGNOSIS — Z9012 Acquired absence of left breast and nipple: Secondary | ICD-10-CM | POA: Diagnosis present

## 2014-11-18 DIAGNOSIS — Z79899 Other long term (current) drug therapy: Secondary | ICD-10-CM

## 2014-11-18 DIAGNOSIS — R112 Nausea with vomiting, unspecified: Secondary | ICD-10-CM | POA: Diagnosis not present

## 2014-11-18 DIAGNOSIS — R1011 Right upper quadrant pain: Secondary | ICD-10-CM

## 2014-11-18 DIAGNOSIS — Z9013 Acquired absence of bilateral breasts and nipples: Secondary | ICD-10-CM | POA: Diagnosis present

## 2014-11-18 DIAGNOSIS — Z809 Family history of malignant neoplasm, unspecified: Secondary | ICD-10-CM

## 2014-11-18 DIAGNOSIS — Z853 Personal history of malignant neoplasm of breast: Secondary | ICD-10-CM

## 2014-11-18 LAB — CBC
HEMATOCRIT: 47.4 % — AB (ref 36.0–46.0)
HEMOGLOBIN: 15.9 g/dL — AB (ref 12.0–15.0)
MCH: 29.9 pg (ref 26.0–34.0)
MCHC: 33.5 g/dL (ref 30.0–36.0)
MCV: 89.3 fL (ref 78.0–100.0)
Platelets: 245 10*3/uL (ref 150–400)
RBC: 5.31 MIL/uL — ABNORMAL HIGH (ref 3.87–5.11)
RDW: 12.8 % (ref 11.5–15.5)
WBC: 8.9 10*3/uL (ref 4.0–10.5)

## 2014-11-18 LAB — URINALYSIS, ROUTINE W REFLEX MICROSCOPIC
Bilirubin Urine: NEGATIVE
GLUCOSE, UA: NEGATIVE mg/dL
HGB URINE DIPSTICK: NEGATIVE
KETONES UR: NEGATIVE mg/dL
Nitrite: NEGATIVE
Protein, ur: NEGATIVE mg/dL
Specific Gravity, Urine: 1.013 (ref 1.005–1.030)
Urobilinogen, UA: 0.2 mg/dL (ref 0.0–1.0)
pH: 7 (ref 5.0–8.0)

## 2014-11-18 LAB — BASIC METABOLIC PANEL
ANION GAP: 9 (ref 5–15)
BUN: 12 mg/dL (ref 6–23)
CHLORIDE: 104 mmol/L (ref 96–112)
CO2: 27 mmol/L (ref 19–32)
Calcium: 9.9 mg/dL (ref 8.4–10.5)
Creatinine, Ser: 0.97 mg/dL (ref 0.50–1.10)
GFR calc Af Amer: 73 mL/min — ABNORMAL LOW (ref >90)
GFR calc non Af Amer: 63 mL/min — ABNORMAL LOW (ref >90)
Glucose, Bld: 128 mg/dL — ABNORMAL HIGH (ref 70–99)
Potassium: 3.9 mmol/L (ref 3.5–5.1)
Sodium: 140 mmol/L (ref 135–145)

## 2014-11-18 LAB — I-STAT CG4 LACTIC ACID, ED
Lactic Acid, Venous: 2.57 mmol/L (ref 0.5–2.0)
Lactic Acid, Venous: 1.53 mmol/L (ref 0.5–2.0)

## 2014-11-18 LAB — URINE MICROSCOPIC-ADD ON

## 2014-11-18 LAB — HEPATIC FUNCTION PANEL
ALT: 19 U/L (ref 0–35)
AST: 29 U/L (ref 0–37)
Albumin: 4.5 g/dL (ref 3.5–5.2)
Alkaline Phosphatase: 50 U/L (ref 39–117)
Bilirubin, Direct: 0.3 mg/dL (ref 0.0–0.5)
Indirect Bilirubin: 0.7 mg/dL (ref 0.3–0.9)
Total Bilirubin: 1 mg/dL (ref 0.3–1.2)
Total Protein: 7.8 g/dL (ref 6.0–8.3)

## 2014-11-18 LAB — LIPASE, BLOOD: Lipase: 49 U/L (ref 11–59)

## 2014-11-18 LAB — I-STAT TROPONIN, ED: TROPONIN I, POC: 0 ng/mL (ref 0.00–0.08)

## 2014-11-18 MED ORDER — DIPHENHYDRAMINE HCL 12.5 MG/5ML PO ELIX
12.5000 mg | ORAL_SOLUTION | Freq: Four times a day (QID) | ORAL | Status: DC | PRN
Start: 2014-11-18 — End: 2014-11-25
  Administered 2014-11-20: 25 mg via ORAL
  Filled 2014-11-18: qty 10

## 2014-11-18 MED ORDER — ONDANSETRON HCL 4 MG/2ML IJ SOLN
4.0000 mg | Freq: Once | INTRAMUSCULAR | Status: AC
  Administered 2014-11-18: 4 mg via INTRAVENOUS
  Filled 2014-11-18: qty 2

## 2014-11-18 MED ORDER — MORPHINE SULFATE 2 MG/ML IJ SOLN: 2.0000 mg | INTRAMUSCULAR | Status: DC | PRN

## 2014-11-18 MED ORDER — LACTATED RINGERS IV SOLN
INTRAVENOUS | Status: DC
  Administered 2014-11-18: 100 mL/h via INTRAVENOUS
  Administered 2014-11-19 – 2014-11-22 (×5): via INTRAVENOUS
  Administered 2014-11-22: 100 mL/h via INTRAVENOUS
  Administered 2014-11-23: 12:00:00 via INTRAVENOUS
  Administered 2014-11-23: 1000 mL via INTRAVENOUS

## 2014-11-18 MED ORDER — MORPHINE SULFATE 4 MG/ML IJ SOLN
4.0000 mg | Freq: Once | INTRAMUSCULAR | Status: AC
  Administered 2014-11-18: 4 mg via INTRAVENOUS
  Filled 2014-11-18: qty 1

## 2014-11-18 MED ORDER — ONDANSETRON HCL 4 MG/2ML IJ SOLN: 4.0000 mg | Freq: Four times a day (QID) | INTRAMUSCULAR | Status: DC | PRN

## 2014-11-18 MED ORDER — CYCLOSPORINE 0.05 % OP EMUL
1.0000 [drp] | Freq: Two times a day (BID) | OPHTHALMIC | Status: DC
  Administered 2014-11-18 – 2014-11-25 (×13): 1 [drp] via OPHTHALMIC
  Filled 2014-11-18 (×16): qty 1

## 2014-11-18 MED ORDER — ASPIRIN 325 MG PO TABS
325.0000 mg | ORAL_TABLET | ORAL | Status: DC
  Filled 2014-11-18: qty 1

## 2014-11-18 MED ORDER — ENOXAPARIN SODIUM 40 MG/0.4ML ~~LOC~~ SOLN
40.0000 mg | Freq: Every day | SUBCUTANEOUS | Status: DC
  Administered 2014-11-18 – 2014-11-22 (×5): 40 mg via SUBCUTANEOUS
  Filled 2014-11-18 (×6): qty 0.4

## 2014-11-18 MED ORDER — PROMETHAZINE HCL 25 MG/ML IJ SOLN
25.0000 mg | Freq: Once | INTRAMUSCULAR | Status: AC
  Administered 2014-11-18: 25 mg via INTRAVENOUS
  Filled 2014-11-18: qty 1

## 2014-11-18 MED ORDER — IOHEXOL 300 MG/ML  SOLN
100.0000 mL | Freq: Once | INTRAMUSCULAR | Status: AC | PRN
  Administered 2014-11-18: 100 mL via INTRAVENOUS

## 2014-11-18 MED ORDER — HYDROMORPHONE HCL 1 MG/ML IJ SOLN
1.0000 mg | Freq: Once | INTRAMUSCULAR | Status: AC
  Administered 2014-11-18: 1 mg via INTRAVENOUS
  Filled 2014-11-18: qty 1

## 2014-11-18 MED ORDER — PROMETHAZINE HCL 25 MG PO TABS: 25.0000 mg | ORAL_TABLET | Freq: Four times a day (QID) | ORAL | Status: DC | PRN

## 2014-11-18 MED ORDER — PANTOPRAZOLE SODIUM 40 MG PO TBEC
80.0000 mg | DELAYED_RELEASE_TABLET | Freq: Every day | ORAL | Status: DC
  Administered 2014-11-19 – 2014-11-25 (×6): 80 mg via ORAL
  Filled 2014-11-18 (×7): qty 2

## 2014-11-18 MED ORDER — DOCUSATE SODIUM 100 MG PO CAPS
100.0000 mg | ORAL_CAPSULE | Freq: Two times a day (BID) | ORAL | Status: DC
  Administered 2014-11-19 – 2014-11-25 (×12): 100 mg via ORAL
  Filled 2014-11-18 (×14): qty 1

## 2014-11-18 MED ORDER — DIPHENHYDRAMINE HCL 50 MG/ML IJ SOLN
12.5000 mg | Freq: Four times a day (QID) | INTRAMUSCULAR | Status: DC | PRN
  Administered 2014-11-18: 25 mg via INTRAVENOUS
  Filled 2014-11-18: qty 1

## 2014-11-18 MED ORDER — IOHEXOL 300 MG/ML  SOLN: 50.0000 mL | Freq: Once | INTRAMUSCULAR | Status: DC | PRN

## 2014-11-18 NOTE — ED Notes (Signed)
Pt is double breast cancer pt that is c/o epigastric pain that radiates across her chest with nausea since lunch time. Pt has taken Rolaids, gas x, on top of her Prilosec and not helping at all.  Pt states that she had similar episode several weeks ago and seen at Urgent care but had subsided by time she got there and they ruled out cardiac.

## 2014-11-18 NOTE — ED Notes (Signed)
Patient transported to CT 

## 2014-11-18 NOTE — ED Provider Notes (Signed)
CSN: [id number]     Arrival date & time 11/18/14  1448 History   First MD Initiated Contact with Patient 11/18/14 1510     Chief Complaint  Patient presents with  . Abdominal Pain  . Chest Pain  . Nausea     (Consider location/radiation/quality/duration/timing/severity/associated sxs/prior Treatment) HPI  Pt is a 59yo female with hx of GERD, hiatal hernia, bilateral breast cancer s/p mastectomy, chemo and radiation, presenting to ED with c/o gradually worsening epigastric pain radiating across her chest and into her lower abdomen.  Pain is sharp and burning, 10/10, nothing makes it better or worse.  Symptoms started after pt ate lunch around 1PM and have been constant since.  Nothing makes pain better or worse. She has tried Rolaids, gas x, as well as her daily Prilosec w/o relief.  Reports 1 episode of vomiting earlier today but had a normal BM.  States she has had intermittent diarrhea for several weeks. She reports a similar episodes several weeks ago and seen at Urgent care but symptoms subsided by the time she got there and they ruled out cardiac cause. She reports having an endoscopy and colonoscopy in Oct/Nov 2015 by Dr. Amedeo Plenty, GI, states he only found a hiatal hernia. Pt does report hx of similar symptoms for the last several months w/o definitive dx. Denies hx of abdominal surgeries. No known cardiac or pulmonary disease. Pt does have a FH of CAD.  No hx of DM.   Past Medical History  Diagnosis Date  . GERD (gastroesophageal reflux disease)   . H/O hiatal hernia   . Breast cancer     bilaterally; "have had chemo & radiation on right side only"  (01/28/2013)   Past Surgical History  Procedure Laterality Date  . Tubal ligation  1983  . Reconstruction breast w/ tram flap Right 2008  . Breast reduction surgery Left 2009  . Breast lumpectomy Right 2006  . Breast surgery    . Breast biopsy Right 2006; 2008  . Breast biopsy Left 12/2012  . Mastectomy Right 2008  . Mastectomy Left  01/28/2013  . Reconstruction breast w/ latissimus dorsi flap Left 01/28/2013  . Mastectomy w/ sentinel node biopsy Left 01/28/2013    Procedure: LEFT TOTAL MASTECTOMY WITH SENTINEL LYMPH NODE BIOPSY;  Surgeon: Adin Hector, MD;  Location: [facility];  Service: General;  Laterality: Left;  . Latissimus flap to breast Left 01/28/2013    Procedure: LEFT LATISSIMUS FLAP TO BREAST WITH SALINE IMPLANT;  Surgeon: Crissie Reese, MD;  Location: [facility];  Service: Plastics;  Laterality: Left;   Family History  Problem Relation Age of Onset  . Heart disease Mother   . Heart disease Father   . Cancer Father    History  Substance Use Topics  . Smoking status: Never Smoker   . Smokeless tobacco: Never Used  . Alcohol Use: 0.0 oz/week     Comment: 01/28/2013 "glass of wine ~ once/month"   OB History    No data available     Review of Systems  Constitutional: Negative for fever, chills, diaphoresis and appetite change.  HENT: Negative for congestion.   Respiratory: Negative for cough and shortness of breath.   Cardiovascular: Positive for chest pain.  Gastrointestinal: Positive for nausea, vomiting, abdominal pain and diarrhea ( intermittent). Negative for constipation.  Genitourinary: Negative for dysuria, urgency, frequency, hematuria, flank pain, vaginal bleeding, vaginal discharge and pelvic pain.  Musculoskeletal: Negative for myalgias and back pain.  All other systems reviewed and are  negative.     Allergies  Review of patient's allergies indicates no known allergies.  Home Medications   Prior to Admission medications   Medication Sig Start Date End Date Taking? Authorizing Provider  anastrozole (ARIMIDEX) 1 MG tablet Take 1 tablet (1 mg total) by mouth daily. 03/03/14  Yes Amy Milda Smart, PA-C  Ca Carbonate-Mag Hydroxide (ROLAIDS PO) Take 1 tablet by mouth daily as needed (indigestion).   Yes Historical Provider, MD  calcium carbonate (OS-CAL) 600 MG TABS Take 600 mg by mouth at bedtime.     Yes Historical Provider, MD  cycloSPORINE (RESTASIS) 0.05 % ophthalmic emulsion Place 1 drop into both eyes 2 (two) times daily.   Yes Historical Provider, MD  Omega-3 Fatty Acids (FISH OIL) 1000 MG CAPS Take 1,000 mg by mouth at bedtime.   Yes Historical Provider, MD  omeprazole (PRILOSEC) 40 MG capsule Take 40 mg by mouth 2 (two) times daily.   Yes Historical Provider, MD  Simethicone (GAS-X PO) Take 1 tablet by mouth daily as needed (indigestion).   Yes Historical Provider, MD  simvastatin (ZOCOR) 40 MG tablet Take 40 mg by mouth at bedtime.     Yes Historical Provider, MD  docusate sodium 100 MG CAPS Take 100 mg by mouth daily. Patient not taking: Reported on 11/18/2014 01/30/13   Crissie Reese, MD  promethazine (PHENERGAN) 25 MG tablet Take 1 tablet (25 mg total) by mouth every 6 (six) hours as needed for nausea or vomiting. 11/18/14   Noland Fordyce, PA-C   BP 121/47 mmHg  Pulse 78  Temp(Src) 98.4 F (36.9 C) (Oral)  Resp 18  Wt 142 lb 3.2 oz (64.5 kg)  SpO2 99% Physical Exam  Constitutional: She appears well-developed and well-nourished. No distress.  Pt lying comfortably in exam bed, NAD.  HENT:  Head: Normocephalic and atraumatic.  Eyes: Conjunctivae are normal. No scleral icterus.  Neck: Normal range of motion.  Cardiovascular: Normal rate, regular rhythm and normal heart sounds.   Pulmonary/Chest: Effort normal and breath sounds normal. No respiratory distress. She has no wheezes. She has no rales. She exhibits no tenderness.  Abdominal: Soft. Bowel sounds are normal. She exhibits no distension and no mass. There is tenderness. There is no rebound and no guarding.  Soft, non-distended, tenderness in epigastrium and lower abdomen. No rebound, guarding, or masses. No CVAT.  Musculoskeletal: Normal range of motion.  Neurological: She is alert.  Skin: Skin is warm and dry. She is not diaphoretic.  Nursing note and vitals reviewed.   ED Course  Procedures (including critical care  time) Labs Review Labs Reviewed  CBC - Abnormal; Notable for the following:    RBC 5.31 (*)    Hemoglobin 15.9 (*)    HCT 47.4 (*)    All other components within normal limits  BASIC METABOLIC PANEL - Abnormal; Notable for the following:    Glucose, Bld 128 (*)    GFR calc non Af Amer 63 (*)    GFR calc Af Amer 73 (*)    All other components within normal limits  URINALYSIS, ROUTINE W REFLEX MICROSCOPIC - Abnormal; Notable for the following:    Leukocytes, UA TRACE (*)    All other components within normal limits  I-STAT CG4 LACTIC ACID, ED - Abnormal; Notable for the following:    Lactic Acid, Venous 2.57 (*)    All other components within normal limits  HEPATIC FUNCTION PANEL  LIPASE, BLOOD  URINE MICROSCOPIC-ADD ON  BASIC METABOLIC PANEL  CBC  LACTIC ACID, PLASMA  I-STAT TROPOININ, ED  I-STAT CG4 LACTIC ACID, ED    Imaging Review Dg Chest 2 View  11/18/2014   CLINICAL DATA:  Chest and epigastric pain for 4 hours  EXAM: CHEST  2 VIEW  COMPARISON:  01/21/2013  FINDINGS: Cardiac shadow is stable. A large hiatal hernia is again identified over the left lung base. Postsurgical changes with breast implants are seen. No focal infiltrate or sizable effusion is noted. No bony abnormality is seen.  IMPRESSION: Large hiatal hernia stable from the prior exam. This may be the etiology of the patient's underlying discomfort.   Electronically Signed   By: Inez Catalina M.D.   On: 11/18/2014 16:04   Ct Abdomen Pelvis W Contrast  11/18/2014   CLINICAL DATA:  Right-sided abdominal pain, nausea and vomiting.  EXAM: CT ABDOMEN AND PELVIS WITH CONTRAST  TECHNIQUE: Multidetector CT imaging of the abdomen and pelvis was performed using the standard protocol following bolus administration of intravenous contrast.  CONTRAST:  163mL OMNIPAQUE IOHEXOL 300 MG/ML  SOLN  COMPARISON:  CT of the chest, abdomen and pelvis on 12/22/2009 and upper GI series on 07/02/2014  FINDINGS: There is change in morphology of  a large hiatal hernia since prior upper GI series and CT studies. In addition to the proximal half of the stomach being contained in the chest, as previously seen by upper GI, there now is extension of the distal body and antrum of the stomach into the chest, paralleling the course of the proximal stomach. The pylorus and proximal duodenum are stretched across the hiatus and the rest of the duodenum is located below the diaphragm. A component of gastric volvulus cannot be excluded by CT. Change in morphology of the herniated stomach could certainly lead to gastric volvulus and would explain acute nausea and vomiting. There is some retained ingested material visible in the portion of the body of the stomach that is isolated below the diaphragm. No evidence to suggest gastric perforation.  Small bowel and colon are unremarkable and show no evidence of obstruction. Sigmoid diverticulosis present without evidence of acute diverticulitis. No evidence of appendicitis. No evidence of free intraperitoneal air.  The liver, gallbladder, pancreas, spleen, adrenal glands and kidneys are within normal limits. No focal masses or enlarged lymph nodes are seen. No abnormal fluid collections or abscess. Uterus and adnexal regions are unremarkable by CT. The bladder is normal. Lower abdominal wall hernia mesh is present without evidence of ventral hernia. Bony structures are unremarkable.  IMPRESSION: Large hiatal hernia which has changed in morphology since prior upper GI series and CT studies. There is concern that this additional herniation of the distal stomach into the chest may predispose to development of gastric volvulus. There is an isolated portion of the body of the stomach below the diaphragm back contains ingested material which may be relatively trapped. Recommend General Surgery consultation.  Critical Value/emergent results were called by telephone at the time of interpretation on 11/18/2014 at 6:48 pm to Dr. Debby Freiberg , who verbally acknowledged these results.   Electronically Signed   By: Aletta Edouard M.D.   On: 11/18/2014 18:54     EKG Interpretation   Date/Time:  Wednesday November 18 2014 14:53:58 EST Ventricular Rate:  95 PR Interval:  143 QRS Duration: 85 QT Interval:  373 QTC Calculation: 469 R Axis:   98 Text Interpretation:  Sinus rhythm Probable left atrial enlargement  Borderline right axis deviation SINCE LAST TRACING HEART RATE HAS  INCREASED Confirmed by Debby Freiberg [phone]) on 11/18/2014 4:16:59 PM      MDM   Final diagnoses:  RUQ pain  Hiatal hernia    Pt is a 59yo female with hx of Hiatal Hernia and bilateral mastectomy due to breast cancer, presenting to ED with c/o epigastric pain, lower abdominal pain, nausea and vomiting. One episode of vomiting after lunch.   On exam, diffuse abdominal tenderness, worse in epigastrium.  CT abd: concerning for large hernia which has changed in morphology since prior upper GI series and CT studies.  There is concern that this additional herniation of the distal stomach into the chest may predispose to development of gastric volvulus.  There is an isolated portion of the body of the stomach below the diaphragm that contains ingested material which may be relatively trapped.  Imaging discussed with Dr. Colin Rhein by radiologist Dr. Kathlene Cote. Dr. Colin Rhein recommended consulting CT surgery due to location of hernia.  Consulted with Dr. Roxy Manns, CT surgery, recommended consulting general surgery.    7:38 PM Consulted with Dr. Marcello Moores, general surgery, who reviewed CT.  Pt is afebrile, no leukocytosis, pt may be discharged home with f/u in office if able to pass PO fluid challenge, however, if unable to tolerate fluids, pt to be admitted.     9:09 PM Pt was able to keep down fluids, however, at moment of discharge, Dr. Marcello Moores was able to examine pt.  Plan has changed to admit pt for observation.       Noland Fordyce, PA-C 11/19/14 [phone]

## 2014-11-18 NOTE — H&P (Signed)
Caroline Mejia is an 59 y.o. female.   Chief Complaint: nausea, vomiting HPI: She is a a pt of Dr Jacinto Halim.  She is s/p mastectomy and chemotherapy in 2014 for recurrent contralateral breast cancer.  She has a known hiatal hernia that Dr Madilyn Fireman is following.  She has had worsening pain and nausea with vomiting for the past few weeks.  Her latest episode start last night and continued today after lunch with severe vomiting.  She presented to ED.  She was unable to tolerate CT contrast without vomiting.  CT showed worsening of her hiatal hernia with lesser curve pulled up above the diaphragm.  There is some food that appears trapped in the body of the stomach below the diaphragm.  She is not on any steroids, chemotherapy or blood thinners.   Past Medical History  Diagnosis Date  . GERD (gastroesophageal reflux disease)   . H/O hiatal hernia   . Breast cancer     bilaterally; "have had chemo & radiation on right side only"  (01/28/2013)    Past Surgical History  Procedure Laterality Date  . Tubal ligation  1983  . Reconstruction breast w/ tram flap Right 2008  . Breast reduction surgery Left 2009  . Breast lumpectomy Right 2006  . Breast surgery    . Breast biopsy Right 2006; 2008  . Breast biopsy Left 12/2012  . Mastectomy Right 2008  . Mastectomy Left 01/28/2013  . Reconstruction breast w/ latissimus dorsi flap Left 01/28/2013  . Mastectomy w/ sentinel node biopsy Left 01/28/2013    Procedure: LEFT TOTAL MASTECTOMY WITH SENTINEL LYMPH NODE BIOPSY;  Surgeon: Ernestene Mention, MD;  Location: [facility] OR;  Service: General;  Laterality: Left;  . Latissimus flap to breast Left 01/28/2013    Procedure: LEFT LATISSIMUS FLAP TO BREAST WITH SALINE IMPLANT;  Surgeon: Etter Sjogren, MD;  Location: [facility] OR;  Service: Plastics;  Laterality: Left;    Family History  Problem Relation Age of Onset  . Heart disease Mother   . Heart disease Father   . Cancer Father    Social History:  reports that she has  never smoked. She has never used smokeless tobacco. She reports that she drinks alcohol. She reports that she does not use illicit drugs.  Allergies: No Known Allergies   (Not in a hospital admission)  Results for orders placed or performed during the hospital encounter of 11/18/14 (from the past 48 hour(s))  CBC     Status: Abnormal   Collection Time: 11/18/14  3:03 PM  Result Value Ref Range   WBC 8.9 4.0 - 10.5 K/uL   RBC 5.31 (H) 3.87 - 5.11 MIL/uL   Hemoglobin 15.9 (H) 12.0 - 15.0 g/dL   HCT 08.6 (H) 57.8 - 46.9 %   MCV 89.3 78.0 - 100.0 fL   MCH 29.9 26.0 - 34.0 pg   MCHC 33.5 30.0 - 36.0 g/dL   RDW 62.9 52.8 - 41.3 %   Platelets 245 150 - 400 K/uL  Basic metabolic panel     Status: Abnormal   Collection Time: 11/18/14  3:03 PM  Result Value Ref Range   Sodium 140 135 - 145 mmol/L   Potassium 3.9 3.5 - 5.1 mmol/L   Chloride 104 96 - 112 mmol/L   CO2 27 19 - 32 mmol/L   Glucose, Bld 128 (H) 70 - 99 mg/dL   BUN 12 6 - 23 mg/dL   Creatinine, Ser 2.44 0.50 - 1.10 mg/dL   Calcium  9.9 8.4 - 10.5 mg/dL   GFR calc non Af Amer 63 (L) >90 mL/min   GFR calc Af Amer 73 (L) >90 mL/min    Comment: (NOTE) The eGFR has been calculated using the CKD EPI equation. This calculation has not been validated in all clinical situations. eGFR's persistently <90 mL/min signify possible Chronic Kidney Disease.    Anion gap 9 5 - 15  I-stat troponin, ED (not at [facility])     Status: None   Collection Time: 11/18/14  3:19 PM  Result Value Ref Range   Troponin i, poc 0.00 0.00 - 0.08 ng/mL   Comment 3            Comment: Due to the release kinetics of cTnI, a negative result within the first hours of the onset of symptoms does not rule out myocardial infarction with certainty. If myocardial infarction is still suspected, repeat the test at appropriate intervals.   Hepatic function panel     Status: None   Collection Time: 11/18/14  3:19 PM  Result Value Ref Range   Total Protein 7.8 6.0 -  8.3 g/dL   Albumin 4.5 3.5 - 5.2 g/dL   AST 29 0 - 37 U/L   ALT 19 0 - 35 U/L   Alkaline Phosphatase 50 39 - 117 U/L   Total Bilirubin 1.0 0.3 - 1.2 mg/dL   Bilirubin, Direct 0.3 0.0 - 0.5 mg/dL   Indirect Bilirubin 0.7 0.3 - 0.9 mg/dL  Lipase, blood     Status: None   Collection Time: 11/18/14  3:19 PM  Result Value Ref Range   Lipase 49 11 - 59 U/L  Urinalysis, Routine w reflex microscopic     Status: Abnormal   Collection Time: 11/18/14  5:30 PM  Result Value Ref Range   Color, Urine YELLOW YELLOW   APPearance CLEAR CLEAR   Specific Gravity, Urine 1.013 1.005 - 1.030   pH 7.0 5.0 - 8.0   Glucose, UA NEGATIVE NEGATIVE mg/dL   Hgb urine dipstick NEGATIVE NEGATIVE   Bilirubin Urine NEGATIVE NEGATIVE   Ketones, ur NEGATIVE NEGATIVE mg/dL   Protein, ur NEGATIVE NEGATIVE mg/dL   Urobilinogen, UA 0.2 0.0 - 1.0 mg/dL   Nitrite NEGATIVE NEGATIVE   Leukocytes, UA TRACE (A) NEGATIVE  Urine microscopic-add on     Status: None   Collection Time: 11/18/14  5:30 PM  Result Value Ref Range   Squamous Epithelial / LPF RARE RARE   WBC, UA 0-2 <3 WBC/hpf   RBC / HPF 0-2 <3 RBC/hpf  I-Stat CG4 Lactic Acid, ED     Status: Abnormal   Collection Time: 11/18/14  7:25 PM  Result Value Ref Range   Lactic Acid, Venous 2.57 (HH) 0.5 - 2.0 mmol/L   Comment NOTIFIED PHYSICIAN    Dg Chest 2 View  11/18/2014   CLINICAL DATA:  Chest and epigastric pain for 4 hours  EXAM: CHEST  2 VIEW  COMPARISON:  01/21/2013  FINDINGS: Cardiac shadow is stable. A large hiatal hernia is again identified over the left lung base. Postsurgical changes with breast implants are seen. No focal infiltrate or sizable effusion is noted. No bony abnormality is seen.  IMPRESSION: Large hiatal hernia stable from the prior exam. This may be the etiology of the patient's underlying discomfort.   Electronically Signed   By: Inez Catalina M.D.   On: 11/18/2014 16:04   Ct Abdomen Pelvis W Contrast  11/18/2014   CLINICAL DATA:   Right-sided abdominal  pain, nausea and vomiting.  EXAM: CT ABDOMEN AND PELVIS WITH CONTRAST  TECHNIQUE: Multidetector CT imaging of the abdomen and pelvis was performed using the standard protocol following bolus administration of intravenous contrast.  CONTRAST:  178mL OMNIPAQUE IOHEXOL 300 MG/ML  SOLN  COMPARISON:  CT of the chest, abdomen and pelvis on 12/22/2009 and upper GI series on 07/02/2014  FINDINGS: There is change in morphology of a large hiatal hernia since prior upper GI series and CT studies. In addition to the proximal half of the stomach being contained in the chest, as previously seen by upper GI, there now is extension of the distal body and antrum of the stomach into the chest, paralleling the course of the proximal stomach. The pylorus and proximal duodenum are stretched across the hiatus and the rest of the duodenum is located below the diaphragm. A component of gastric volvulus cannot be excluded by CT. Change in morphology of the herniated stomach could certainly lead to gastric volvulus and would explain acute nausea and vomiting. There is some retained ingested material visible in the portion of the body of the stomach that is isolated below the diaphragm. No evidence to suggest gastric perforation.  Small bowel and colon are unremarkable and show no evidence of obstruction. Sigmoid diverticulosis present without evidence of acute diverticulitis. No evidence of appendicitis. No evidence of free intraperitoneal air.  The liver, gallbladder, pancreas, spleen, adrenal glands and kidneys are within normal limits. No focal masses or enlarged lymph nodes are seen. No abnormal fluid collections or abscess. Uterus and adnexal regions are unremarkable by CT. The bladder is normal. Lower abdominal wall hernia mesh is present without evidence of ventral hernia. Bony structures are unremarkable.  IMPRESSION: Large hiatal hernia which has changed in morphology since prior upper GI series and CT studies.  There is concern that this additional herniation of the distal stomach into the chest may predispose to development of gastric volvulus. There is an isolated portion of the body of the stomach below the diaphragm back contains ingested material which may be relatively trapped. Recommend General Surgery consultation.  Critical Value/emergent results were called by telephone at the time of interpretation on 11/18/2014 at 6:48 pm to Dr. Debby Freiberg , who verbally acknowledged these results.   Electronically Signed   By: Aletta Edouard M.D.   On: 11/18/2014 18:54    Review of Systems  Constitutional: Negative for fever and chills.  HENT: Negative for sore throat.   Eyes: Negative for blurred vision.  Respiratory: Negative for cough, hemoptysis and shortness of breath.   Cardiovascular: Positive for chest pain. Negative for palpitations.  Gastrointestinal: Positive for nausea, vomiting and abdominal pain. Negative for diarrhea and constipation.  Genitourinary: Negative for dysuria, urgency and frequency.  Musculoskeletal: Negative for myalgias.  Skin: Negative for rash.  Neurological: Negative for dizziness and headaches.    Blood pressure 131/77, pulse 74, temperature 97.2 F (36.2 C), temperature source Oral, resp. rate 20, SpO2 100 %. Physical Exam  Constitutional: She is oriented to person, place, and time. She appears well-developed and well-nourished.  HENT:  Head: Normocephalic and atraumatic.  Eyes: Conjunctivae and EOM are normal. Pupils are equal, round, and reactive to light.  Neck: Normal range of motion. Neck supple.  Cardiovascular: Normal rate and regular rhythm.   Respiratory: Effort normal and breath sounds normal. No respiratory distress.  GI: Soft. She exhibits no distension. There is no tenderness. There is no rebound and no guarding.  Musculoskeletal: Normal range of motion.  Neurological: She is alert and oriented to person, place, and time.  Skin: Skin is warm and  dry.     Assessment/Plan 59 y.o. F with worsening of hiatal hernia by signs and symptoms as well as CT.  CT also is concerning for impending volvulus.  I will have her admitted for observation.  If she continues to have nausea, she may need surgical correction this admission.  Will recheck labs in AM.  Will make NPO for tonight.    Naveya Ellerman C. 5/63/1497, 0:26 PM

## 2014-11-18 NOTE — ED Provider Notes (Signed)
Medical screening examination/treatment/procedure(s) were conducted as a shared visit with non-physician practitioner(s) and myself.  I personally evaluated the patient during the encounter.   EKG Interpretation   Date/Time:  Wednesday November 18 2014 14:53:58 EST Ventricular Rate:  95 PR Interval:  143 QRS Duration: 85 QT Interval:  373 QTC Calculation: 469 R Axis:   98 Text Interpretation:  Sinus rhythm Probable left atrial enlargement  Borderline right axis deviation SINCE LAST TRACING HEART RATE HAS  INCREASED Confirmed by Debby Freiberg [phone]) on 11/18/2014 4:16:59 PM       Briefly, pt is a 59 y.o. female presenting with abd pain beginning at 1 pm today.  Pain constant, nonspecific.  No dyspnea, however pt has had nausea and vomiting.  I performed an examination on the patient including cardiac, pulmonary, and gi systems which were remarkable for RUQ and RLQ tenderness.  CT scan ordered.     This revealed a large hiatal hernia with concern for possible developing volvulus. Consulted surgery who admitted the patient.  Debby Freiberg, MD 11/18/14 [phone]

## 2014-11-19 ENCOUNTER — Encounter (HOSPITAL_COMMUNITY): Admission: EM | Disposition: A | Discharge status: Home / Self Care | Payer: Self-pay | Source: Home / Self Care

## 2014-11-19 ENCOUNTER — Encounter (HOSPITAL_COMMUNITY): Payer: Self-pay

## 2014-11-19 HISTORY — PX: ESOPHAGOGASTRODUODENOSCOPY: SHX5428

## 2014-11-19 LAB — BASIC METABOLIC PANEL
Anion gap: 6 (ref 5–15)
BUN: 10 mg/dL (ref 6–23)
CALCIUM: 9.2 mg/dL (ref 8.4–10.5)
CO2: 28 mmol/L (ref 19–32)
Chloride: 108 mmol/L (ref 96–112)
Creatinine, Ser: 0.87 mg/dL (ref 0.50–1.10)
GFR calc Af Amer: 83 mL/min — ABNORMAL LOW (ref >90)
GFR, EST NON AFRICAN AMERICAN: 72 mL/min — AB (ref >90)
Glucose, Bld: 93 mg/dL (ref 70–99)
POTASSIUM: 3.5 mmol/L (ref 3.5–5.1)
Sodium: 142 mmol/L (ref 135–145)

## 2014-11-19 LAB — CBC
HCT: 39.4 % (ref 36.0–46.0)
Hemoglobin: 13 g/dL (ref 12.0–15.0)
MCH: 30 pg (ref 26.0–34.0)
MCHC: 33 g/dL (ref 30.0–36.0)
MCV: 90.8 fL (ref 78.0–100.0)
Platelets: 235 10*3/uL (ref 150–400)
RBC: 4.34 MIL/uL (ref 3.87–5.11)
RDW: 13.1 % (ref 11.5–15.5)
WBC: 6.6 10*3/uL (ref 4.0–10.5)

## 2014-11-19 LAB — LACTIC ACID, PLASMA: LACTIC ACID, VENOUS: 0.8 mmol/L (ref 0.5–2.0)

## 2014-11-19 SURGERY — EGD (ESOPHAGOGASTRODUODENOSCOPY)
Anesthesia: Moderate Sedation

## 2014-11-19 MED ORDER — FENTANYL CITRATE 0.05 MG/ML IJ SOLN
INTRAMUSCULAR | Status: AC
  Filled 2014-11-19: qty 2

## 2014-11-19 MED ORDER — MIDAZOLAM HCL 10 MG/2ML IJ SOLN
INTRAMUSCULAR | Status: AC
Start: 2014-11-19 — End: 2014-11-19
  Filled 2014-11-19: qty 2

## 2014-11-19 MED ORDER — FENTANYL CITRATE 0.05 MG/ML IJ SOLN
INTRAMUSCULAR | Status: DC | PRN
  Administered 2014-11-19: 25 ug via INTRAVENOUS

## 2014-11-19 MED ORDER — DIPHENHYDRAMINE HCL 50 MG/ML IJ SOLN
INTRAMUSCULAR | Status: AC
  Filled 2014-11-19: qty 1

## 2014-11-19 MED ORDER — SODIUM CHLORIDE 0.9 % IV SOLN: INTRAVENOUS | Status: DC

## 2014-11-19 MED ORDER — MIDAZOLAM HCL 10 MG/2ML IJ SOLN
INTRAMUSCULAR | Status: DC | PRN
  Administered 2014-11-19: 2 mg via INTRAVENOUS

## 2014-11-19 MED ORDER — BUTAMBEN-TETRACAINE-BENZOCAINE 2-2-14 % EX AERO
INHALATION_SPRAY | CUTANEOUS | Status: DC | PRN
  Administered 2014-11-19: 2 via TOPICAL

## 2014-11-19 MED ORDER — ACETAMINOPHEN 325 MG PO TABS
650.0000 mg | ORAL_TABLET | ORAL | Status: DC | PRN
  Administered 2014-11-19: 650 mg via ORAL
  Filled 2014-11-19: qty 2

## 2014-11-19 MED ORDER — OXYCODONE-ACETAMINOPHEN 5-325 MG PO TABS: 1.0000 | ORAL_TABLET | ORAL | Status: DC | PRN

## 2014-11-19 NOTE — Progress Notes (Signed)
Subjective: She feels much better today. No abdominal pain or nausea this am  Objective: Vital signs in last 24 hours: Temp:  [97.2 F (36.2 C)-98.4 F (36.9 C)] 98.2 F (36.8 C) (02/11 0457) Pulse Rate:  [59-100] 74 (02/11 0457) Resp:  [9-20] 14 (02/11 0457) BP: (112-157)/(47-103) 125/58 mmHg (02/11 0457) SpO2:  [92 %-100 %] 97 % (02/11 0457) Weight:  [142 lb 3.2 oz (64.5 kg)] 142 lb 3.2 oz (64.5 kg) (02/11 0847) Last BM Date: 11/18/14  Intake/Output from previous day: 02/10 0701 - 02/11 0700 In: 43.3 [I.V.:43.3] Out: 200 [Urine:200] Intake/Output this shift:    Resp: clear to auscultation bilaterally Cardio: regular rate and rhythm GI: soft, nontender  Lab Results:   Recent Labs  11/18/14 1503 11/19/14 0537  WBC 8.9 6.6  HGB 15.9* 13.0  HCT 47.4* 39.4  PLT 245 235   BMET  Recent Labs  11/18/14 1503 11/19/14 0537  NA 140 142  K 3.9 3.5  CL 104 108  CO2 27 28  GLUCOSE 128* 93  BUN 12 10  CREATININE 0.97 0.87  CALCIUM 9.9 9.2   PT/INR No results for input(s): LABPROT, INR in the last 72 hours. ABG No results for input(s): PHART, HCO3 in the last 72 hours.  Invalid input(s): PCO2, PO2  Studies/Results: Dg Chest 2 View  11/18/2014   CLINICAL DATA:  Chest and epigastric pain for 4 hours  EXAM: CHEST  2 VIEW  COMPARISON:  01/21/2013  FINDINGS: Cardiac shadow is stable. A large hiatal hernia is again identified over the left lung base. Postsurgical changes with breast implants are seen. No focal infiltrate or sizable effusion is noted. No bony abnormality is seen.  IMPRESSION: Large hiatal hernia stable from the prior exam. This may be the etiology of the patient's underlying discomfort.   Electronically Signed   By: Inez Catalina M.D.   On: 11/18/2014 16:04   Ct Abdomen Pelvis W Contrast  11/18/2014   CLINICAL DATA:  Right-sided abdominal pain, nausea and vomiting.  EXAM: CT ABDOMEN AND PELVIS WITH CONTRAST  TECHNIQUE: Multidetector CT imaging of the  abdomen and pelvis was performed using the standard protocol following bolus administration of intravenous contrast.  CONTRAST:  140mL OMNIPAQUE IOHEXOL 300 MG/ML  SOLN  COMPARISON:  CT of the chest, abdomen and pelvis on 12/22/2009 and upper GI series on 07/02/2014  FINDINGS: There is change in morphology of a large hiatal hernia since prior upper GI series and CT studies. In addition to the proximal half of the stomach being contained in the chest, as previously seen by upper GI, there now is extension of the distal body and antrum of the stomach into the chest, paralleling the course of the proximal stomach. The pylorus and proximal duodenum are stretched across the hiatus and the rest of the duodenum is located below the diaphragm. A component of gastric volvulus cannot be excluded by CT. Change in morphology of the herniated stomach could certainly lead to gastric volvulus and would explain acute nausea and vomiting. There is some retained ingested material visible in the portion of the body of the stomach that is isolated below the diaphragm. No evidence to suggest gastric perforation.  Small bowel and colon are unremarkable and show no evidence of obstruction. Sigmoid diverticulosis present without evidence of acute diverticulitis. No evidence of appendicitis. No evidence of free intraperitoneal air.  The liver, gallbladder, pancreas, spleen, adrenal glands and kidneys are within normal limits. No focal masses or enlarged lymph nodes are seen.  No abnormal fluid collections or abscess. Uterus and adnexal regions are unremarkable by CT. The bladder is normal. Lower abdominal wall hernia mesh is present without evidence of ventral hernia. Bony structures are unremarkable.  IMPRESSION: Large hiatal hernia which has changed in morphology since prior upper GI series and CT studies. There is concern that this additional herniation of the distal stomach into the chest may predispose to development of gastric  volvulus. There is an isolated portion of the body of the stomach below the diaphragm back contains ingested material which may be relatively trapped. Recommend General Surgery consultation.  Critical Value/emergent results were called by telephone at the time of interpretation on 11/18/2014 at 6:48 pm to Dr. Debby Freiberg , who verbally acknowledged these results.   Electronically Signed   By: Aletta Edouard M.D.   On: 11/18/2014 18:54    Anti-infectives: Anti-infectives    None      Assessment/Plan: s/p * No surgery found * Large hiatal hernia. Will consult GI for possible endoscopy to make sure stomach is not ischemic. If no ischemia then she may benefit from Lap Nissen during this admission but less urgently. She has seen Dr. Amedeo Plenty in the last few months with [facility]. Will ask them for a consultation     TOTH III,PAUL S 11/19/2014

## 2014-11-19 NOTE — Op Note (Signed)
[place], [address]   ENDOSCOPY PROCEDURE REPORT  PATIENT: Mejia, Caroline  MR#: [id number] BIRTHDATE: 01-16-1956 , 61  yrs. old GENDER: female ENDOSCOPIST:Ramsha Lonigro, MD REFERRED BY: Dr. Star Age, Dr. Donnie Coffin, Dr. Teena Irani PROCEDURE DATE:  01-Dec-2014 PROCEDURE:   upper endoscopy ASA CLASS:    II INDICATIONS: abdominal pain, nausea, and vomiting in association with a large, complicated hiatal hernia MEDICATION: fentanyl 25 g, Versed 2 mg IV TOPICAL ANESTHETIC:   Cetacaine spray  DESCRIPTION OF PROCEDURE:   After the risks and benefits of the procedure were explained, informed consent was obtained. the patient was brought from her hospital room to the [facility] long endoscopy unit. She received the above sedation and remained stable throughout the procedure. The Pentax Gastroscope [id number]  endoscope was introduced through the mouth  and advanced to the second portion of the duodenum .  The instrument was slowly withdrawn as the mucosa was fully examined.    The endoscope was inserted into the esophagus under direct vision without significant difficulty.  The larynx was briefly seen and appeared grossly normal.  Esophagus itself was normal in its entirety, without evidence of reflux esophagitis, Barrett's esophagus, varices, infection, neoplasia, Mallory-Weiss tear, or any ring or stricture. The Z line was at 35 cm from the mouth.  The patient's hiatal hernia pouch was entered. I had to maneuver the scope somewhat to locate the portion of the stomach leading into the antrum. There was some striped erythema of the antrum, but no erosive changes or mucosal hemorrhages to suggest resolving or active mucosal ischemia. No ulcers, polyps, or masses were seen in the stomach. A retroflexed view of the proximal stomach showed the large hiatal hernia from its inferior perspective, with a widely patulous diaphragmatic hiatus  measuring about 10 cm. From retroflexed viewing, it was not 100% clear, but it did not appear to me that there was a paraesophageal component to the hiatal hernia.however, the hiatal hernia itself was somewhat redundant and folded on itself, requiring the above-mentioned scope manipulation when first entering the stomach, in order to be able to access the distal portion of the stomach.  The pylorus, duodenal bulb, and second duodenum looked normal.  The scope was then withdrawn from the patient and the procedure completed.  No biopsies were obtained. The patient tolerated the procedure very well.  COMPLICATIONS: There were no immediate complications.  ENDOSCOPIC IMPRESSION: 1. No evidence of active or resolving mucosal ischemia at the present time 2. Large, redundant hiatal hernia with very patulous diaphragmatic hiatus. Anatomy made passage of the scope into the distal stomach slightly difficult. However, I am not convinced that there is currently a paraesophageal component to the hiatal hernia.  RECOMMENDATIONS: 1. Above findings discussed with Dr. Marlou Starks, who will handle the patient's management from here 2. We will be on standby in the event that we can be of further assistance in this patient's care   _______________________________ eSigned:  Ronald Lobo, MD 2014-12-01 1:21 PM     cc:  CPT CODES: ICD CODES:  The ICD and CPT codes recommended by this software are interpretations from the data that the clinical staff has captured with the software.  The verification of the translation of this report to the ICD and CPT codes and modifiers is the sole responsibility of the health care institution and practicing physician where this report was generated.  [employer]. will not be held responsible for the validity of  the ICD and CPT codes included on this report.  AMA assumes no liability for data contained or not contained herein. CPT is a  Designer, television/film set of the [employer].  PATIENT NAME:  Caroline Mejia, Caroline Mejia MR#: [id number]

## 2014-11-19 NOTE — Progress Notes (Signed)
Subjective: She feels much better, no pain.  Her nausea and vomiting is better.    Objective: Vital signs in last 24 hours: Temp:  [97.2 F (36.2 C)-98.4 F (36.9 C)] 98.2 F (36.8 C) (02/11 0457) Pulse Rate:  [59-100] 74 (02/11 0457) Resp:  [9-20] 14 (02/11 0457) BP: (112-157)/(47-103) 125/58 mmHg (02/11 0457) SpO2:  [92 %-100 %] 97 % (02/11 0457) Weight:  [64.5 kg (142 lb 3.2 oz)] 64.5 kg (142 lb 3.2 oz) (02/11 0847) Last BM Date: 11/18/14 NPO Afebrile, VSS Labs OK  Intake/Output from previous day: 02/10 0701 - 02/11 0700 In: 43.3 [I.V.:43.3] Out: 200 [Urine:200] Intake/Output this shift:    General appearance: alert, cooperative and no distress Resp: clear to auscultation bilaterally GI: soft, non-tender; bowel sounds normal; no masses,  no organomegaly  Lab Results:   Recent Labs  11/18/14 1503 11/19/14 0537  WBC 8.9 6.6  HGB 15.9* 13.0  HCT 47.4* 39.4  PLT 245 235    BMET  Recent Labs  11/18/14 1503 11/19/14 0537  NA 140 142  K 3.9 3.5  CL 104 108  CO2 27 28  GLUCOSE 128* 93  BUN 12 10  CREATININE 0.97 0.87  CALCIUM 9.9 9.2   PT/INR No results for input(s): LABPROT, INR in the last 72 hours.   Recent Labs Lab 11/18/14 1519  AST 29  ALT 19  ALKPHOS 50  BILITOT 1.0  PROT 7.8  ALBUMIN 4.5     Lipase     Component Value Date/Time   LIPASE 49 11/18/2014 1519     Studies/Results: Dg Chest 2 View  11/18/2014   CLINICAL DATA:  Chest and epigastric pain for 4 hours  EXAM: CHEST  2 VIEW  COMPARISON:  01/21/2013  FINDINGS: Cardiac shadow is stable. A large hiatal hernia is again identified over the left lung base. Postsurgical changes with breast implants are seen. No focal infiltrate or sizable effusion is noted. No bony abnormality is seen.  IMPRESSION: Large hiatal hernia stable from the prior exam. This may be the etiology of the patient's underlying discomfort.   Electronically Signed   By: Inez Catalina M.D.   On: 11/18/2014 16:04    Ct Abdomen Pelvis W Contrast  11/18/2014   CLINICAL DATA:  Right-sided abdominal pain, nausea and vomiting.  EXAM: CT ABDOMEN AND PELVIS WITH CONTRAST  TECHNIQUE: Multidetector CT imaging of the abdomen and pelvis was performed using the standard protocol following bolus administration of intravenous contrast.  CONTRAST:  118mL OMNIPAQUE IOHEXOL 300 MG/ML  SOLN  COMPARISON:  CT of the chest, abdomen and pelvis on 12/22/2009 and upper GI series on 07/02/2014  FINDINGS: There is change in morphology of a large hiatal hernia since prior upper GI series and CT studies. In addition to the proximal half of the stomach being contained in the chest, as previously seen by upper GI, there now is extension of the distal body and antrum of the stomach into the chest, paralleling the course of the proximal stomach. The pylorus and proximal duodenum are stretched across the hiatus and the rest of the duodenum is located below the diaphragm. A component of gastric volvulus cannot be excluded by CT. Change in morphology of the herniated stomach could certainly lead to gastric volvulus and would explain acute nausea and vomiting. There is some retained ingested material visible in the portion of the body of the stomach that is isolated below the diaphragm. No evidence to suggest gastric perforation.  Small bowel and colon are  unremarkable and show no evidence of obstruction. Sigmoid diverticulosis present without evidence of acute diverticulitis. No evidence of appendicitis. No evidence of free intraperitoneal air.  The liver, gallbladder, pancreas, spleen, adrenal glands and kidneys are within normal limits. No focal masses or enlarged lymph nodes are seen. No abnormal fluid collections or abscess. Uterus and adnexal regions are unremarkable by CT. The bladder is normal. Lower abdominal wall hernia mesh is present without evidence of ventral hernia. Bony structures are unremarkable.  IMPRESSION: Large hiatal hernia which has  changed in morphology since prior upper GI series and CT studies. There is concern that this additional herniation of the distal stomach into the chest may predispose to development of gastric volvulus. There is an isolated portion of the body of the stomach below the diaphragm back contains ingested material which may be relatively trapped. Recommend General Surgery consultation.  Critical Value/emergent results were called by telephone at the time of interpretation on 11/18/2014 at 6:48 pm to Dr. Debby Freiberg , who verbally acknowledged these results.   Electronically Signed   By: Aletta Edouard M.D.   On: 11/18/2014 18:54    Medications: . cycloSPORINE  1 drop Both Eyes BID  . docusate sodium  100 mg Oral BID  . enoxaparin (LOVENOX) injection  40 mg Subcutaneous QHS  . pantoprazole  80 mg Oral Daily    Assessment/Plan Nausea and vomiting Hiatal Hernia with possible volvulus Bilateral breast cancer on Chemotherapy/radiation therapy S/p left mastectomy GERD   Plan:  Will discuss with Dr. Marlou Starks.      Caroline Mejia 11/19/2014

## 2014-11-19 NOTE — Progress Notes (Signed)
Upper endoscopy well tolerated. No mucosal ischemia seen. Findings discussed with Dr. Marlou Starks. I have started a clear liquid diet, per his okay.  Please see dictated procedure report for more details.  We will sign off. However, please call us if we can be of further assistance with this patient.  Cleotis Nipper, M.D. [phone]

## 2014-11-19 NOTE — Consult Note (Signed)
Referring Provider: Dr. Star Age Primary Care Physician:  Donnie Coffin, MD Primary Gastroenterologist:  Dr. Teena Irani  Reason for Consultation:  Complicated hiatal hernia  HPI: Caroline Mejia is a 59 y.o. female admitted through the emergency room last night with a brief history of severe upper abdominal pain which would come in waves, as well as nausea and vomiting. She was unable to hold down contrast for CT scan, but it nonetheless showed progression of her known moderately large hiatal hernia, with lesser curve of the stomach now up in the chest. Overnight, the patient has had marked improvement in her symptoms, and is now essentially pain-free, just a small amount of residual pain.  Endoscopy by Dr. Teena Irani last fall showed a 6 cm hiatal hernia, and at that time, an upper GI series did not show any paraesophageal component to it.  The surgeons have requested updated endoscopic evaluation to exclude ischemia, the presence of which would indicate the need for more urgent surgery, versus anticipated elective repair of the hernia.   Past Medical History  Diagnosis Date  . GERD (gastroesophageal reflux disease)   . H/O hiatal hernia   . Breast cancer     bilaterally; "have had chemo & radiation on right side only"  (01/28/2013)    Past Surgical History  Procedure Laterality Date  . Tubal ligation  1983  . Reconstruction breast w/ tram flap Right 2008  . Breast reduction surgery Left 2009  . Breast lumpectomy Right 2006  . Breast surgery    . Breast biopsy Right 2006; 2008  . Breast biopsy Left 12/2012  . Mastectomy Right 2008  . Mastectomy Left 01/28/2013  . Reconstruction breast w/ latissimus dorsi flap Left 01/28/2013  . Mastectomy w/ sentinel node biopsy Left 01/28/2013    Procedure: LEFT TOTAL MASTECTOMY WITH SENTINEL LYMPH NODE BIOPSY;  Surgeon: Adin Hector, MD;  Location: [facility];  Service: General;  Laterality: Left;  . Latissimus flap to breast Left 01/28/2013     Procedure: LEFT LATISSIMUS FLAP TO BREAST WITH SALINE IMPLANT;  Surgeon: Crissie Reese, MD;  Location: [facility];  Service: Plastics;  Laterality: Left;    Prior to Admission medications   Medication Sig Start Date End Date Taking? Authorizing Provider  anastrozole (ARIMIDEX) 1 MG tablet Take 1 tablet (1 mg total) by mouth daily. 03/03/14  Yes Amy Milda Smart, PA-C  Ca Carbonate-Mag Hydroxide (ROLAIDS PO) Take 1 tablet by mouth daily as needed (indigestion).   Yes Historical Provider, MD  calcium carbonate (OS-CAL) 600 MG TABS Take 600 mg by mouth at bedtime.    Yes Historical Provider, MD  cycloSPORINE (RESTASIS) 0.05 % ophthalmic emulsion Place 1 drop into both eyes 2 (two) times daily.   Yes Historical Provider, MD  Omega-3 Fatty Acids (FISH OIL) 1000 MG CAPS Take 1,000 mg by mouth at bedtime.   Yes Historical Provider, MD  omeprazole (PRILOSEC) 40 MG capsule Take 40 mg by mouth 2 (two) times daily.   Yes Historical Provider, MD  Simethicone (GAS-X PO) Take 1 tablet by mouth daily as needed (indigestion).   Yes Historical Provider, MD  simvastatin (ZOCOR) 40 MG tablet Take 40 mg by mouth at bedtime.     Yes Historical Provider, MD  docusate sodium 100 MG CAPS Take 100 mg by mouth daily. Patient not taking: Reported on 11/18/2014 01/30/13   Crissie Reese, MD  promethazine (PHENERGAN) 25 MG tablet Take 1 tablet (25 mg total) by mouth every 6 (six) hours as  needed for nausea or vomiting. 11/18/14   Noland Fordyce, PA-C    Current Facility-Administered Medications  Medication Dose Route Frequency Provider Last Rate Last Dose  . cycloSPORINE (RESTASIS) 0.05 % ophthalmic emulsion 1 drop  1 drop Both Eyes BID Leighton Ruff, MD   1 drop at 11/19/14 1003  . diphenhydrAMINE (BENADRYL) injection 12.5-25 mg  12.5-25 mg Intravenous W1U PRN Leighton Ruff, MD   25 mg at 11/18/14 2335   Or  . diphenhydrAMINE (BENADRYL) 12.5 MG/5ML elixir 12.5-25 mg  12.5-25 mg Oral X3A PRN Leighton Ruff, MD      . docusate sodium  (COLACE) capsule 100 mg  355 mg Oral BID Leighton Ruff, MD   732 mg at 11/19/14 1003  . enoxaparin (LOVENOX) injection 40 mg  40 mg Subcutaneous QHS Leighton Ruff, MD   40 mg at 11/18/14 2335  . lactated ringers infusion   Intravenous Continuous Leighton Ruff, MD 202 mL/hr at 11/19/14 0914    . morphine 2 MG/ML injection 2-4 mg  2-4 mg Intravenous R4Y PRN Leighton Ruff, MD      . ondansetron [facility]) injection 4 mg  4 mg Intravenous H0W PRN Leighton Ruff, MD      . pantoprazole (PROTONIX) EC tablet 80 mg  80 mg Oral Daily Leighton Ruff, MD   80 mg at 11/19/14 1003    Allergies as of 11/18/2014  . (No Known Allergies)    Family History  Problem Relation Age of Onset  . Heart disease Mother   . Heart disease Father   . Cancer Father     History   Social History  . Marital Status: Divorced    Spouse Name: N/A  . Number of Children: N/A  . Years of Education: N/A   Occupational History  . Not on file.   Social History Main Topics  . Smoking status: Never Smoker   . Smokeless tobacco: Never Used  . Alcohol Use: 0.0 oz/week     Comment: 01/28/2013 "glass of wine ~ once/month"  . Drug Use: No  . Sexual Activity: No   Other Topics Concern  . Not on file   Social History Narrative    Review of Systems: Not obtained  Physical Exam: Vital signs in last 24 hours: Temp:  [97.2 F (36.2 C)-98.9 F (37.2 C)] 98 F (36.7 C) (02/11 0939) Pulse Rate:  [59-100] 77 (02/11 0939) Resp:  [9-20] 18 (02/11 0939) BP: (112-157)/(47-103) 141/90 mmHg (02/11 0939) SpO2:  [92 %-100 %] 97 % (02/11 0939) Weight:  [64.5 kg (142 lb 3.2 oz)] 64.5 kg (142 lb 3.2 oz) (02/11 0847) Last BM Date: 11/18/14 This is a pleasant, well-nourished Caucasian female lying in bed in no distress whatsoever. She is anicteric and without pallor. The skin is warm and dry. The chest is clear and the heart is without any murmur or arrhythmia. Abdomen has soft, normal bowel sounds, no distention, and no organomegaly,  guarding, mass effect, or tenderness  Intake/Output from previous day: 02/10 0701 - 02/11 0700 In: 43.3 [I.V.:43.3] Out: 200 [Urine:200] Intake/Output this shift: Total I/O In: 0  Out: 300 [Urine:300]  Lab Results:  Recent Labs  11/18/14 1503 11/19/14 0537  WBC 8.9 6.6  HGB 15.9* 13.0  HCT 47.4* 39.4  PLT 245 235   BMET  Recent Labs  11/18/14 1503 11/19/14 0537  NA 140 142  K 3.9 3.5  CL 104 108  CO2 27 28  GLUCOSE 128* 93  BUN 12 10  CREATININE 0.97 0.87  CALCIUM 9.9 9.2   LFT  Recent Labs  11/18/14 1519  PROT 7.8  ALBUMIN 4.5  AST 29  ALT 19  ALKPHOS 50  BILITOT 1.0  BILIDIR 0.3  IBILI 0.7   PT/INR No results for input(s): LABPROT, INR in the last 72 hours.  Studies/Results: Dg Chest 2 View  11/18/2014   CLINICAL DATA:  Chest and epigastric pain for 4 hours  EXAM: CHEST  2 VIEW  COMPARISON:  01/21/2013  FINDINGS: Cardiac shadow is stable. A large hiatal hernia is again identified over the left lung base. Postsurgical changes with breast implants are seen. No focal infiltrate or sizable effusion is noted. No bony abnormality is seen.  IMPRESSION: Large hiatal hernia stable from the prior exam. This may be the etiology of the patient's underlying discomfort.   Electronically Signed   By: Inez Catalina M.D.   On: 11/18/2014 16:04   Ct Abdomen Pelvis W Contrast  11/18/2014   CLINICAL DATA:  Right-sided abdominal pain, nausea and vomiting.  EXAM: CT ABDOMEN AND PELVIS WITH CONTRAST  TECHNIQUE: Multidetector CT imaging of the abdomen and pelvis was performed using the standard protocol following bolus administration of intravenous contrast.  CONTRAST:  17mL OMNIPAQUE IOHEXOL 300 MG/ML  SOLN  COMPARISON:  CT of the chest, abdomen and pelvis on 12/22/2009 and upper GI series on 07/02/2014  FINDINGS: There is change in morphology of a large hiatal hernia since prior upper GI series and CT studies. In addition to the proximal half of the stomach being contained in  the chest, as previously seen by upper GI, there now is extension of the distal body and antrum of the stomach into the chest, paralleling the course of the proximal stomach. The pylorus and proximal duodenum are stretched across the hiatus and the rest of the duodenum is located below the diaphragm. A component of gastric volvulus cannot be excluded by CT. Change in morphology of the herniated stomach could certainly lead to gastric volvulus and would explain acute nausea and vomiting. There is some retained ingested material visible in the portion of the body of the stomach that is isolated below the diaphragm. No evidence to suggest gastric perforation.  Small bowel and colon are unremarkable and show no evidence of obstruction. Sigmoid diverticulosis present without evidence of acute diverticulitis. No evidence of appendicitis. No evidence of free intraperitoneal air.  The liver, gallbladder, pancreas, spleen, adrenal glands and kidneys are within normal limits. No focal masses or enlarged lymph nodes are seen. No abnormal fluid collections or abscess. Uterus and adnexal regions are unremarkable by CT. The bladder is normal. Lower abdominal wall hernia mesh is present without evidence of ventral hernia. Bony structures are unremarkable.  IMPRESSION: Large hiatal hernia which has changed in morphology since prior upper GI series and CT studies. There is concern that this additional herniation of the distal stomach into the chest may predispose to development of gastric volvulus. There is an isolated portion of the body of the stomach below the diaphragm back contains ingested material which may be relatively trapped. Recommend General Surgery consultation.  Critical Value/emergent results were called by telephone at the time of interpretation on 11/18/2014 at 6:48 pm to Dr. Debby Freiberg , who verbally acknowledged these results.   Electronically Signed   By: Aletta Edouard M.D.   On: 11/18/2014 18:54     Impression: Large hiatal hernia with possible transient volvulus, now clinically resolved  Plan: Proceed to endoscopic evaluation later today. Risks reviewed with the  patient, she is agreeable. Further management to depend on those findings, as determined by the surgeons.     [place] V  11/19/2014, 10:23 AM

## 2014-11-20 ENCOUNTER — Encounter (HOSPITAL_COMMUNITY): Payer: Self-pay | Admitting: Gastroenterology

## 2014-11-20 MED ORDER — CHLORHEXIDINE GLUCONATE 0.12 % MT SOLN
15.0000 mL | Freq: Two times a day (BID) | OROMUCOSAL | Status: DC
  Administered 2014-11-20 – 2014-11-24 (×8): 15 mL via OROMUCOSAL
  Filled 2014-11-20 (×12): qty 15

## 2014-11-20 MED ORDER — CETYLPYRIDINIUM CHLORIDE 0.05 % MT LIQD: 7.0000 mL | Freq: Two times a day (BID) | OROMUCOSAL | Status: DC

## 2014-11-20 MED ORDER — CETYLPYRIDINIUM CHLORIDE 0.05 % MT LIQD
7.0000 mL | Freq: Two times a day (BID) | OROMUCOSAL | Status: DC
  Administered 2014-11-21 – 2014-11-23 (×2): 7 mL via OROMUCOSAL

## 2014-11-20 MED ORDER — ANASTROZOLE 1 MG PO TABS
1.0000 mg | ORAL_TABLET | Freq: Every day | ORAL | Status: DC
  Administered 2014-11-20 – 2014-11-24 (×5): 1 mg via ORAL
  Filled 2014-11-20 (×7): qty 1

## 2014-11-20 MED ORDER — SIMVASTATIN 40 MG PO TABS
40.0000 mg | ORAL_TABLET | Freq: Every day | ORAL | Status: DC
  Administered 2014-11-20 – 2014-11-24 (×5): 40 mg via ORAL
  Filled 2014-11-20 (×6): qty 1

## 2014-11-20 NOTE — Progress Notes (Signed)
1 Day Post-Op  Subjective: She feels fine, she has O2 on but does not use at home.  I will have them check her off O2 to be sure she does not require it.  Ok with clears no pain like she had on admit.  Objective: Vital signs in last 24 hours: Temp:  [97.8 F (36.6 C)-98.9 F (37.2 C)] 98 F (36.7 C) (02/12 0510) Pulse Rate:  [57-83] 75 (02/12 0510) Resp:  [12-30] 16 (02/12 0510) BP: (134-196)/(68-90) 142/80 mmHg (02/12 0510) SpO2:  [93 %-100 %] 96 % (02/12 0510) Weight:  [64.411 kg (142 lb)-64.5 kg (142 lb 3.2 oz)] 64.411 kg (142 lb) (02/11 1217) Last BM Date: 11/18/14 EGD 11/19/14:   No evidence of active or resolving mucosal ischemia at the present time  Large, redundant hiatal hernia with very patulous diaphragmatic hiatus. Anatomy made passage of the scope into the distal stomach slightly difficult. However, I am not convinced that there is currently a paraesophageal component to the hiatal hernia Bilateral breast cancer Chemotherapy/radiation therapy)  PO: 240 recorded Afebrile, VSS Labs OK Intake/Output from previous day: 02/11 0701 - 02/12 0700 In: 2640 [P.O.:240; I.V.:2400] Out: 1850 [Urine:1850] Intake/Output this shift:    General appearance: alert, cooperative and no distress GI: soft, non-tender; bowel sounds normal; no masses,  no organomegaly Chest :  clear Lab Results:   Recent Labs  11/18/14 1503 11/19/14 0537  WBC 8.9 6.6  HGB 15.9* 13.0  HCT 47.4* 39.4  PLT 245 235    BMET  Recent Labs  11/18/14 1503 11/19/14 0537  NA 140 142  K 3.9 3.5  CL 104 108  CO2 27 28  GLUCOSE 128* 93  BUN 12 10  CREATININE 0.97 0.87  CALCIUM 9.9 9.2   PT/INR No results for input(s): LABPROT, INR in the last 72 hours.   Recent Labs Lab 11/18/14 1519  AST 29  ALT 19  ALKPHOS 50  BILITOT 1.0  PROT 7.8  ALBUMIN 4.5     Lipase     Component Value Date/Time   LIPASE 49 11/18/2014 1519     Studies/Results: Dg Chest 2 View  11/18/2014   CLINICAL DATA:   Chest and epigastric pain for 4 hours  EXAM: CHEST  2 VIEW  COMPARISON:  01/21/2013  FINDINGS: Cardiac shadow is stable. A large hiatal hernia is again identified over the left lung base. Postsurgical changes with breast implants are seen. No focal infiltrate or sizable effusion is noted. No bony abnormality is seen.  IMPRESSION: Large hiatal hernia stable from the prior exam. This may be the etiology of the patient's underlying discomfort.   Electronically Signed   By: Inez Catalina M.D.   On: 11/18/2014 16:04   Ct Abdomen Pelvis W Contrast  11/18/2014   CLINICAL DATA:  Right-sided abdominal pain, nausea and vomiting.  EXAM: CT ABDOMEN AND PELVIS WITH CONTRAST  TECHNIQUE: Multidetector CT imaging of the abdomen and pelvis was performed using the standard protocol following bolus administration of intravenous contrast.  CONTRAST:  163mL OMNIPAQUE IOHEXOL 300 MG/ML  SOLN  COMPARISON:  CT of the chest, abdomen and pelvis on 12/22/2009 and upper GI series on 07/02/2014  FINDINGS: There is change in morphology of a large hiatal hernia since prior upper GI series and CT studies. In addition to the proximal half of the stomach being contained in the chest, as previously seen by upper GI, there now is extension of the distal body and antrum of the stomach into the chest, paralleling  the course of the proximal stomach. The pylorus and proximal duodenum are stretched across the hiatus and the rest of the duodenum is located below the diaphragm. A component of gastric volvulus cannot be excluded by CT. Change in morphology of the herniated stomach could certainly lead to gastric volvulus and would explain acute nausea and vomiting. There is some retained ingested material visible in the portion of the body of the stomach that is isolated below the diaphragm. No evidence to suggest gastric perforation.  Small bowel and colon are unremarkable and show no evidence of obstruction. Sigmoid diverticulosis present without evidence  of acute diverticulitis. No evidence of appendicitis. No evidence of free intraperitoneal air.  The liver, gallbladder, pancreas, spleen, adrenal glands and kidneys are within normal limits. No focal masses or enlarged lymph nodes are seen. No abnormal fluid collections or abscess. Uterus and adnexal regions are unremarkable by CT. The bladder is normal. Lower abdominal wall hernia mesh is present without evidence of ventral hernia. Bony structures are unremarkable.  IMPRESSION: Large hiatal hernia which has changed in morphology since prior upper GI series and CT studies. There is concern that this additional herniation of the distal stomach into the chest may predispose to development of gastric volvulus. There is an isolated portion of the body of the stomach below the diaphragm back contains ingested material which may be relatively trapped. Recommend General Surgery consultation.  Critical Value/emergent results were called by telephone at the time of interpretation on 11/18/2014 at 6:48 pm to Dr. Debby Freiberg , who verbally acknowledged these results.   Electronically Signed   By: Aletta Edouard M.D.   On: 11/18/2014 18:54    Medications: . antiseptic oral rinse  7 mL Mouth Rinse q12n4p  . chlorhexidine  15 mL Mouth Rinse BID  . cycloSPORINE  1 drop Both Eyes BID  . docusate sodium  100 mg Oral BID  . enoxaparin (LOVENOX) injection  40 mg Subcutaneous QHS  . pantoprazole  80 mg Oral Daily    Assessment/Plan Nausea and vomiting Hiatal Hernia with possible volvulus S/p EGD, 11/19/14 Dr. Ronald Lobo  (S/p left mastectomy GERD    Plan:  Keep her on clear liquids for now and aim for repair early next week with Dr. Lucia Gaskins.   Caroline Mejia 11/20/2014

## 2014-11-20 NOTE — Progress Notes (Signed)
UR completed 

## 2014-11-21 ENCOUNTER — Encounter (HOSPITAL_COMMUNITY): Payer: Self-pay | Admitting: Student

## 2014-11-21 DIAGNOSIS — K219 Gastro-esophageal reflux disease without esophagitis: Secondary | ICD-10-CM | POA: Diagnosis present

## 2014-11-21 DIAGNOSIS — Z9221 Personal history of antineoplastic chemotherapy: Secondary | ICD-10-CM | POA: Diagnosis not present

## 2014-11-21 DIAGNOSIS — K449 Diaphragmatic hernia without obstruction or gangrene: Secondary | ICD-10-CM | POA: Diagnosis present

## 2014-11-21 DIAGNOSIS — R112 Nausea with vomiting, unspecified: Secondary | ICD-10-CM | POA: Diagnosis present

## 2014-11-21 DIAGNOSIS — Z853 Personal history of malignant neoplasm of breast: Secondary | ICD-10-CM | POA: Diagnosis not present

## 2014-11-21 DIAGNOSIS — Z79899 Other long term (current) drug therapy: Secondary | ICD-10-CM | POA: Diagnosis not present

## 2014-11-21 DIAGNOSIS — Z9013 Acquired absence of bilateral breasts and nipples: Secondary | ICD-10-CM | POA: Diagnosis present

## 2014-11-21 DIAGNOSIS — Z809 Family history of malignant neoplasm, unspecified: Secondary | ICD-10-CM | POA: Diagnosis not present

## 2014-11-21 DIAGNOSIS — Z9012 Acquired absence of left breast and nipple: Secondary | ICD-10-CM | POA: Diagnosis present

## 2014-11-21 MED ORDER — LORATADINE 5 MG/5ML PO SYRP
10.0000 mg | ORAL_SOLUTION | Freq: Every day | ORAL | Status: DC | PRN
Start: 2014-11-21 — End: 2014-11-21

## 2014-11-21 MED ORDER — LORATADINE 10 MG PO TABS
10.0000 mg | ORAL_TABLET | Freq: Every day | ORAL | Status: DC | PRN
  Administered 2014-11-21: 10 mg via ORAL
  Filled 2014-11-21 (×2): qty 1

## 2014-11-21 NOTE — Progress Notes (Signed)
Patient ID: Caroline Mejia, female   DOB: 1956/04/18, 59 y.o.   MRN: [id number] [facility] Progress Note:   2 Days Post-Op  Subjective: Mental status is clear.  Discussed symptoms that got her here.  The plan is for Dr. Lucia Gaskins to repair her hiatal hernia this week while on the LDOW.   Objective: Vital signs in last 24 hours: Temp:  [97.8 F (36.6 C)-99.5 F (37.5 C)] 97.8 F (36.6 C) (02/13 0810) Pulse Rate:  [70-79] 70 (02/13 0810) Resp:  [16-20] 16 (02/13 0810) BP: (139-157)/(69-101) 155/94 mmHg (02/13 0810) SpO2:  [95 %-96 %] 95 % (02/13 0810)  Intake/Output from previous day: 02/12 0701 - 02/13 0700 In: 3840 [P.O.:1440; I.V.:2400] Out: 5601 [Urine:5600; Stool:1] Intake/Output this shift: Total I/O In: 480 [P.O.:480] Out: 450 [Urine:450]  Physical Exam: Work of breathing is not labored although she states that her allergies are bothering her.  No pain or complaints  Lab Results:  No results found for this or any previous visit (from the past 48 hour(s)).  Radiology/Results: No results found.  Anti-infectives: Anti-infectives    None      Assessment/Plan: Problem List: Patient Active Problem List   Diagnosis Date Noted  . Hiatal hernia 11/18/2014  . GERD (gastroesophageal reflux disease) 03/03/2014  . Breast cancer of upper-inner quadrant of left female breast 06/07/2013  . Breast cancer, right breast 06/07/2013  . Osteopenia 06/03/2013    Large type III mixed hiatus hernia by UGI in Sept (reviewed).  For repair this next week.  Claritin ordered per patient request.   2 Days Post-Op      Matt B. Hassell Done, MD, [facility] [phone] beeper [phone]  11/21/2014 8:52 AM

## 2014-11-21 NOTE — Progress Notes (Signed)
Utilization Review completed.  

## 2014-11-22 NOTE — Progress Notes (Signed)
Patient ID: Caroline Mejia, female   DOB: 15-Mar-1956, 59 y.o.   MRN: [id number] [facility] Progress Note:   3 Days Post-Op  Subjective: Mental status is clear Objective: Vital signs in last 24 hours: Temp:  [97.7 F (36.5 C)-98 F (36.7 C)] 98 F (36.7 C) (02/14 0500) Pulse Rate:  [74-78] 78 (02/14 0500) Resp:  [14-16] 14 (02/14 0500) BP: (142-160)/(78-93) 142/78 mmHg (02/14 0500) SpO2:  [100 %] 100 % (02/14 0500)  Intake/Output from previous day: 02/13 0701 - 02/14 0700 In: 3150 [P.O.:750; I.V.:2400] Out: 4150 [Urine:4150] Intake/Output this shift:    Physical Exam: Work of breathing is normal.  Up and about.  Asymptomatic  Lab Results:  No results found for this or any previous visit (from the past 48 hour(s)).  Radiology/Results: No results found.  Anti-infectives: Anti-infectives    None      Assessment/Plan: Problem List: Patient Active Problem List   Diagnosis Date Noted  . Hiatal hernia 11/18/2014  . GERD (gastroesophageal reflux disease) 03/03/2014  . Breast cancer of upper-inner quadrant of left female breast 06/07/2013  . Breast cancer, right breast 06/07/2013  . Osteopenia 06/03/2013    Hiatal hernia repair prior to discharge per Dr. Pollie Friar schedule.  3 Days Post-Op    LOS: 1 day   Matt B. Hassell Done, MD, [facility] [phone] beeper [phone]  11/22/2014 8:52 AM

## 2014-11-22 NOTE — Progress Notes (Signed)
I spent 30 minutes with the patient going over the options of treatment for her hiatal hernia.  She has had progressive symptoms over the past year and is ready to get this repaired.  I gave her a book on surgery for GERD.  I reviewed the risks and indications of hiatal hernia repair, Nissen fundoplication, and possible endosocpy.  Risks include, but are not limited to, bleeding, infection, bowel perforation, recurrence of the hernia, and recurrent symptoms.  If things go well, I will plan her surgery tomorrow.  She is single and lives with her mother.  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  She lives near her sister and daughter, [place] Mole.  Her son also lives nearby.  Alphonsa Overall, MD, [facility] Pager: [phone] Office phone:  [phone]

## 2014-11-23 ENCOUNTER — Encounter (HOSPITAL_COMMUNITY): Admission: EM | Disposition: A | Discharge status: Home / Self Care | Payer: Self-pay | Source: Home / Self Care

## 2014-11-23 ENCOUNTER — Encounter (HOSPITAL_COMMUNITY): Payer: Self-pay

## 2014-11-23 ENCOUNTER — Inpatient Hospital Stay (HOSPITAL_COMMUNITY): Payer: 59 | Admitting: Certified Registered Nurse Anesthetist

## 2014-11-23 HISTORY — PX: LAPAROSCOPIC NISSEN FUNDOPLICATION: SHX1932

## 2014-11-23 LAB — SURGICAL PCR SCREEN
MRSA, PCR: NEGATIVE
Staphylococcus aureus: NEGATIVE

## 2014-11-23 SURGERY — FUNDOPLICATION, NISSEN, LAPAROSCOPIC
Anesthesia: General

## 2014-11-23 MED ORDER — EPHEDRINE SULFATE 50 MG/ML IJ SOLN
INTRAMUSCULAR | Status: AC
  Filled 2014-11-23: qty 1

## 2014-11-23 MED ORDER — GLYCOPYRROLATE 0.2 MG/ML IJ SOLN
INTRAMUSCULAR | Status: AC
  Filled 2014-11-23: qty 3

## 2014-11-23 MED ORDER — NEOSTIGMINE METHYLSULFATE 10 MG/10ML IV SOLN
INTRAVENOUS | Status: AC
  Filled 2014-11-23: qty 1

## 2014-11-23 MED ORDER — NEOSTIGMINE METHYLSULFATE 10 MG/10ML IV SOLN
INTRAVENOUS | Status: DC | PRN
  Administered 2014-11-23: 4 mg via INTRAVENOUS

## 2014-11-23 MED ORDER — FENTANYL CITRATE 0.05 MG/ML IJ SOLN
INTRAMUSCULAR | Status: AC
  Filled 2014-11-23: qty 5

## 2014-11-23 MED ORDER — LIDOCAINE HCL (CARDIAC) 20 MG/ML IV SOLN
INTRAVENOUS | Status: DC | PRN
  Administered 2014-11-23: 80 mg via INTRAVENOUS

## 2014-11-23 MED ORDER — ONDANSETRON HCL 4 MG/2ML IJ SOLN
INTRAMUSCULAR | Status: AC
  Filled 2014-11-23: qty 2

## 2014-11-23 MED ORDER — FENTANYL CITRATE 0.05 MG/ML IJ SOLN
INTRAMUSCULAR | Status: DC | PRN
  Administered 2014-11-23 (×7): 50 ug via INTRAVENOUS

## 2014-11-23 MED ORDER — FENTANYL CITRATE 0.05 MG/ML IJ SOLN
25.0000 ug | INTRAMUSCULAR | Status: DC | PRN
  Administered 2014-11-23 (×2): 50 ug via INTRAVENOUS

## 2014-11-23 MED ORDER — MIDAZOLAM HCL 5 MG/5ML IJ SOLN
INTRAMUSCULAR | Status: DC | PRN
  Administered 2014-11-23: 2 mg via INTRAVENOUS

## 2014-11-23 MED ORDER — 0.9 % SODIUM CHLORIDE (POUR BTL) OPTIME
TOPICAL | Status: DC | PRN
  Administered 2014-11-23: 1000 mL

## 2014-11-23 MED ORDER — EPHEDRINE SULFATE 50 MG/ML IJ SOLN
INTRAMUSCULAR | Status: DC | PRN
  Administered 2014-11-23: 15 mg via INTRAVENOUS
  Administered 2014-11-23: 10 mg via INTRAVENOUS

## 2014-11-23 MED ORDER — ENOXAPARIN SODIUM 40 MG/0.4ML ~~LOC~~ SOLN
40.0000 mg | SUBCUTANEOUS | Status: DC
  Administered 2014-11-24 – 2014-11-25 (×2): 40 mg via SUBCUTANEOUS
  Filled 2014-11-23 (×3): qty 0.4

## 2014-11-23 MED ORDER — ROCURONIUM BROMIDE 100 MG/10ML IV SOLN
INTRAVENOUS | Status: AC
  Filled 2014-11-23: qty 1

## 2014-11-23 MED ORDER — PHENYLEPHRINE HCL 10 MG/ML IJ SOLN
INTRAMUSCULAR | Status: DC | PRN
  Administered 2014-11-23: 80 ug via INTRAVENOUS

## 2014-11-23 MED ORDER — CEFAZOLIN SODIUM-DEXTROSE 2-3 GM-% IV SOLR
INTRAVENOUS | Status: AC
  Filled 2014-11-23: qty 50

## 2014-11-23 MED ORDER — ONDANSETRON HCL 4 MG/2ML IJ SOLN
INTRAMUSCULAR | Status: DC | PRN
  Administered 2014-11-23: 4 mg via INTRAVENOUS

## 2014-11-23 MED ORDER — GLYCOPYRROLATE 0.2 MG/ML IJ SOLN
INTRAMUSCULAR | Status: DC | PRN
  Administered 2014-11-23: 0.6 mg via INTRAVENOUS

## 2014-11-23 MED ORDER — LACTATED RINGERS IR SOLN
Status: DC | PRN
  Administered 2014-11-23: 1000 mL

## 2014-11-23 MED ORDER — MORPHINE SULFATE 10 MG/ML IJ SOLN
1.0000 mg | INTRAMUSCULAR | Status: DC | PRN
  Administered 2014-11-23: 2 mg via INTRAVENOUS
  Administered 2014-11-24 (×2): 1 mg via INTRAVENOUS
  Filled 2014-11-23 (×3): qty 1

## 2014-11-23 MED ORDER — FENTANYL CITRATE 0.05 MG/ML IJ SOLN
INTRAMUSCULAR | Status: AC
  Filled 2014-11-23: qty 2

## 2014-11-23 MED ORDER — LACTATED RINGERS IV SOLN: INTRAVENOUS | Status: DC | PRN

## 2014-11-23 MED ORDER — BUPIVACAINE-EPINEPHRINE 0.25% -1:200000 IJ SOLN
INTRAMUSCULAR | Status: DC | PRN
  Administered 2014-11-23: 30 mL

## 2014-11-23 MED ORDER — BUPIVACAINE-EPINEPHRINE 0.25% -1:200000 IJ SOLN
INTRAMUSCULAR | Status: AC
  Filled 2014-11-23: qty 1

## 2014-11-23 MED ORDER — POTASSIUM CHLORIDE IN NACL 20-0.45 MEQ/L-% IV SOLN
INTRAVENOUS | Status: DC
  Administered 2014-11-23 – 2014-11-24 (×3): via INTRAVENOUS
  Filled 2014-11-23 (×10): qty 1000

## 2014-11-23 MED ORDER — ROCURONIUM BROMIDE 100 MG/10ML IV SOLN
INTRAVENOUS | Status: DC | PRN
  Administered 2014-11-23: 5 mg via INTRAVENOUS
  Administered 2014-11-23: 40 mg via INTRAVENOUS

## 2014-11-23 MED ORDER — MIDAZOLAM HCL 2 MG/2ML IJ SOLN
INTRAMUSCULAR | Status: AC
  Filled 2014-11-23: qty 2

## 2014-11-23 MED ORDER — DEXAMETHASONE SODIUM PHOSPHATE 10 MG/ML IJ SOLN
INTRAMUSCULAR | Status: DC | PRN
Start: 2014-11-23 — End: 2014-11-23
  Administered 2014-11-23: 10 mg via INTRAVENOUS

## 2014-11-23 MED ORDER — ONDANSETRON HCL 4 MG/2ML IJ SOLN: 4.0000 mg | Freq: Once | INTRAMUSCULAR | Status: DC | PRN

## 2014-11-23 MED ORDER — PROMETHAZINE HCL 25 MG/ML IJ SOLN
25.0000 mg | Freq: Four times a day (QID) | INTRAMUSCULAR | Status: DC | PRN
  Administered 2014-11-23: 25 mg via INTRAVENOUS
  Filled 2014-11-23: qty 1

## 2014-11-23 MED ORDER — PHENYLEPHRINE 40 MCG/ML (10ML) SYRINGE FOR IV PUSH (FOR BLOOD PRESSURE SUPPORT)
PREFILLED_SYRINGE | INTRAVENOUS | Status: AC
  Filled 2014-11-23: qty 10

## 2014-11-23 MED ORDER — LACTATED RINGERS IV SOLN: INTRAVENOUS | Status: DC

## 2014-11-23 MED ORDER — PROPOFOL 10 MG/ML IV BOLUS
INTRAVENOUS | Status: DC | PRN
  Administered 2014-11-23: 160 mg via INTRAVENOUS

## 2014-11-23 MED ORDER — SUCCINYLCHOLINE CHLORIDE 20 MG/ML IJ SOLN
INTRAMUSCULAR | Status: DC | PRN
  Administered 2014-11-23: 100 mg via INTRAVENOUS

## 2014-11-23 MED ORDER — SODIUM CHLORIDE 0.9 % IJ SOLN
INTRAMUSCULAR | Status: AC
  Filled 2014-11-23: qty 10

## 2014-11-23 MED ORDER — LIDOCAINE HCL (CARDIAC) 20 MG/ML IV SOLN
INTRAVENOUS | Status: AC
  Filled 2014-11-23: qty 5

## 2014-11-23 MED ORDER — ONDANSETRON HCL 4 MG/2ML IJ SOLN: 4.0000 mg | Freq: Four times a day (QID) | INTRAMUSCULAR | Status: DC | PRN

## 2014-11-23 MED ORDER — CEFAZOLIN SODIUM-DEXTROSE 2-3 GM-% IV SOLR
INTRAVENOUS | Status: DC | PRN
  Administered 2014-11-23: 2 g via INTRAVENOUS

## 2014-11-23 MED ORDER — PROPOFOL 10 MG/ML IV BOLUS
INTRAVENOUS | Status: AC
  Filled 2014-11-23: qty 20

## 2014-11-23 SURGICAL SUPPLY — 52 items
APPLIER CLIP ROT 10 11.4 M/L (STAPLE)
BENZOIN TINCTURE PRP APPL 2/3 (GAUZE/BANDAGES/DRESSINGS) IMPLANT
CLAMP ENDO BABCK 10MM (STAPLE) IMPLANT
CLIP APPLIE ROT 10 11.4 M/L (STAPLE) IMPLANT
DECANTER SPIKE VIAL GLASS SM (MISCELLANEOUS) ×2 IMPLANT
DEVICE SUT QUICK LOAD TK 5 (STAPLE) ×16 IMPLANT
DEVICE SUTURE ENDOST 10MM (ENDOMECHANICALS) ×2 IMPLANT
DISSECTOR BLUNT TIP ENDO 5MM (MISCELLANEOUS) IMPLANT
DRAIN PENROSE 18X1/2 LTX STRL (DRAIN) ×2 IMPLANT
DRAPE LAPAROSCOPIC ABDOMINAL (DRAPES) ×2 IMPLANT
ELECT REM PT RETURN 9FT ADLT (ELECTROSURGICAL) ×2
ELECTRODE REM PT RTRN 9FT ADLT (ELECTROSURGICAL) ×1 IMPLANT
GLOVE BIOGEL PI IND STRL 7.0 (GLOVE) ×4 IMPLANT
GLOVE BIOGEL PI INDICATOR 7.0 (GLOVE) ×4
GLOVE SURG SIGNA 7.5 PF LTX (GLOVE) ×2 IMPLANT
GOWN SPEC L4 XLG W/TWL (GOWN DISPOSABLE) ×2 IMPLANT
GOWN STRL REUS W/ TWL XL LVL3 (GOWN DISPOSABLE) ×3 IMPLANT
GOWN STRL REUS W/TWL LRG LVL3 (GOWN DISPOSABLE) ×4 IMPLANT
GOWN STRL REUS W/TWL XL LVL3 (GOWN DISPOSABLE) ×3
GRASPER ENDO BABCOCK 10 (MISCELLANEOUS) IMPLANT
GRASPER ENDO BABCOCK 10MM (MISCELLANEOUS)
KIT BASIN OR (CUSTOM PROCEDURE TRAY) ×2 IMPLANT
LIQUID BAND (GAUZE/BANDAGES/DRESSINGS) ×2 IMPLANT
NS IRRIG 1000ML POUR BTL (IV SOLUTION) ×2 IMPLANT
PENCIL BUTTON HOLSTER BLD 10FT (ELECTRODE) IMPLANT
RETRACTOR LAPSCP 12X46 CVD (ENDOMECHANICALS) IMPLANT
RTRCTR LAPSCP 12X46 CVD (ENDOMECHANICALS)
SET IRRIG TUBING LAPAROSCOPIC (IRRIGATION / IRRIGATOR) ×2 IMPLANT
SHEARS HARMONIC ACE PLUS 36CM (ENDOMECHANICALS) ×2 IMPLANT
SOLUTION ANTI FOG 6CC (MISCELLANEOUS) IMPLANT
STAPLER VISISTAT 35W (STAPLE) IMPLANT
STRIP CLOSURE SKIN 1/2X4 (GAUZE/BANDAGES/DRESSINGS) IMPLANT
SUT ETHIBOND 2 0 SH (SUTURE) ×3
SUT ETHIBOND 2 0 SH 36X2 (SUTURE) ×3 IMPLANT
SUT MNCRL AB 4-0 PS2 18 (SUTURE) ×2 IMPLANT
SUT SURGIDAC NAB ES-9 0 48 120 (SUTURE) ×16 IMPLANT
SUT VIC AB 4-0 SH 18 (SUTURE) IMPLANT
SUT VICRYL 0 TIES 12 18 (SUTURE) IMPLANT
TIP INNERVISION DETACH 40FR (MISCELLANEOUS) IMPLANT
TIP INNERVISION DETACH 50FR (MISCELLANEOUS) IMPLANT
TIP INNERVISION DETACH 56FR (MISCELLANEOUS) ×2 IMPLANT
TIPS INNERVISION DETACH 40FR (MISCELLANEOUS)
TOWEL OR 17X26 10 PK STRL BLUE (TOWEL DISPOSABLE) ×2 IMPLANT
TRAY FOLEY CATH 14FRSI W/METER (CATHETERS) ×2 IMPLANT
TRAY LAPAROSCOPIC (CUSTOM PROCEDURE TRAY) ×2 IMPLANT
TROCAR BLADELESS OPT 5 75 (ENDOMECHANICALS) ×2 IMPLANT
TROCAR SLEEVE XCEL 5X75 (ENDOMECHANICALS) ×4 IMPLANT
TROCAR XCEL 12X100 BLDLESS (ENDOMECHANICALS) ×2 IMPLANT
TROCAR XCEL BLADELESS 5X75MML (TROCAR) ×2 IMPLANT
TROCAR XCEL NON-BLD 11X100MML (ENDOMECHANICALS) ×2 IMPLANT
TROCAR XCEL UNIV SLVE 11M 100M (ENDOMECHANICALS) IMPLANT
TUBING INSUFFLATION 10FT LAP (TUBING) IMPLANT

## 2014-11-23 NOTE — Anesthesia Preprocedure Evaluation (Addendum)
Anesthesia Evaluation  Patient identified by MRN, date of birth, ID band Patient awake    Reviewed: Allergy & Precautions, NPO status , Patient's Chart, lab work & pertinent test results  History of Anesthesia Complications Negative for: history of anesthetic complications  Airway Mallampati: II  TM Distance: >3 FB Neck ROM: Full    Dental no notable dental hx. (+) Dental Advisory Given, Poor Dentition   Pulmonary neg pulmonary ROS,  breath sounds clear to auscultation  Pulmonary exam normal       Cardiovascular negative cardio ROS  Rhythm:Regular Rate:Normal     Neuro/Psych negative neurological ROS  negative psych ROS   GI/Hepatic Neg liver ROS, hiatal hernia, GERD-  Medicated and Controlled,  Endo/Other  negative endocrine ROS  Renal/GU negative Renal ROS  negative genitourinary   Musculoskeletal negative musculoskeletal ROS (+)   Abdominal   Peds negative pediatric ROS (+)  Hematology negative hematology ROS (+)   Anesthesia Other Findings Hx of breast ca, reports axillary dissection on R not L and that blood pressures have been ok to be taken on the L  Reproductive/Obstetrics negative OB ROS                           Anesthesia Physical Anesthesia Plan  ASA: II  Anesthesia Plan: General   Post-op Pain Management:    Induction: Intravenous  Airway Management Planned: Oral ETT  Additional Equipment:   Intra-op Plan:   Post-operative Plan: Extubation in OR  Informed Consent: I have reviewed the patients History and Physical, chart, labs and discussed the procedure including the risks, benefits and alternatives for the proposed anesthesia with the patient or authorized representative who has indicated his/her understanding and acceptance.   Dental advisory given  Plan Discussed with: CRNA  Anesthesia Plan Comments:         Anesthesia Quick Evaluation

## 2014-11-23 NOTE — Anesthesia Procedure Notes (Addendum)
Procedure Name: Intubation Date/Time: 11/23/2014 10:41 AM Performed by: Montel Clock Pre-anesthesia Checklist: Patient identified, Emergency Drugs available, Suction available, Patient being monitored and Timeout performed Patient Re-evaluated:Patient Re-evaluated prior to inductionOxygen Delivery Method: Circle system utilized Preoxygenation: Pre-oxygenation with 100% oxygen Intubation Type: IV induction and Rapid sequence Laryngoscope Size: Mac and 3 Grade View: Grade I Tube type: Oral Tube size: 7.0 mm Number of attempts: 1 Airway Equipment and Method: Stylet Placement Confirmation: ETT inserted through vocal cords under direct vision,  positive ETCO2 and breath sounds checked- equal and bilateral Secured at: 21 cm Tube secured with: Tape Dental Injury: Teeth and Oropharynx as per pre-operative assessment

## 2014-11-23 NOTE — Anesthesia Postprocedure Evaluation (Signed)
  Anesthesia Post-op Note  Patient: Caroline Mejia  Procedure(s) Performed: Procedure(s) (LRB): LAPAROSCOPIC NISSEN FUNDOPLICATION (N/A)  Patient Location: PACU  Anesthesia Type: General  Level of Consciousness: awake and alert   Airway and Oxygen Therapy: Patient Spontanous Breathing  Post-op Pain: mild  Post-op Assessment: Post-op Vital signs reviewed, Patient's Cardiovascular Status Stable, Respiratory Function Stable, Patent Airway and No signs of Nausea or vomiting  Last Vitals:  Filed Vitals:   11/23/14 1345  BP: 139/74  Pulse: 74  Temp: 36.4 C  Resp: 20    Post-op Vital Signs: stable   Complications: No apparent anesthesia complications

## 2014-11-23 NOTE — Transfer of Care (Signed)
Immediate Anesthesia Transfer of Care Note  Patient: Caroline Mejia  Procedure(s) Performed: Procedure(s) (LRB): LAPAROSCOPIC NISSEN FUNDOPLICATION (N/A)  Patient Location: PACU  Anesthesia Type: General  Level of Consciousness: sedated, patient cooperative and responds to stimulation  Airway & Oxygen Therapy: Patient Spontanous Breathing and Patient connected to face mask oxgen  Post-op Assessment: Report given to PACU RN and Post -op Vital signs reviewed and stable  Post vital signs: Reviewed and stable  Complications: No apparent anesthesia complications

## 2014-11-24 ENCOUNTER — Inpatient Hospital Stay (HOSPITAL_COMMUNITY): Payer: 59

## 2014-11-24 ENCOUNTER — Encounter (HOSPITAL_COMMUNITY): Payer: Self-pay | Admitting: Surgery

## 2014-11-24 MED ORDER — IOHEXOL 300 MG/ML  SOLN
150.0000 mL | Freq: Once | INTRAMUSCULAR | Status: AC | PRN
  Administered 2014-11-24: 25 mL via ORAL

## 2014-11-24 NOTE — Op Note (Signed)
NAME:  Caroline Mejia, Caroline Mejia NO.:  [id number]  MEDICAL RECORD NO.:  [id number]  LOCATION:  63                         FACILITY:  [facility]  PHYSICIAN:  Caroline Mejia, M.D.  DATE OF BIRTH:  1955/10/26  DATE OF PROCEDURE:  11/23/2014                               OPERATIVE REPORT   PREOPERATIVE DIAGNOSIS:  Large hiatal hernia with over 50% of the stomach above the diaphragm.  POSTOPERATIVE DIAGNOSIS:  Large hiatal hernia with over 50% of the stomach above the diaphragm.  PROCEDURE:  Laparoscopic Nissen fundoplication and repair of hiatal hernia.  SURGEON:  Caroline Mejia, M.D.  ASSISTANT:  Caroline Caprice. Hassell Done, MD  ANESTHESIA:  General endotracheal supervised by Caroline Mejia.  ESTIMATED BLOOD LOSS:  Minimal.  DRAINS LEFT IN:  None.  LOCAL:  Medicine use was 30 mL of 0.25% Marcaine with epi.  SPECIMEN:  Hernia sac.  INDICATION FOR PROCEDURE:  Caroline Mejia is a 59 year old white female, who sees Dr. Donnie Mejia as her primary care doctor.  She has also had history of breast cancer in the past and has had Mejia by Dr. Fanny Mejia and Caroline Mejia, and is followed from an Oncology standpoint by Dr. Lurline Mejia.  She presented to [facility] on November 18, 2014, with worsening signs and symptoms from a large hiatal hernia.  There is concern about an impending volvulus; however, with decompression and resuscitation, she responded with improvement in her symptoms.  She has had chronic symptoms for over a year from her hiatal hernia.  A discussion was carried out with her about the options of going home and come back for elective Mejia versus Mejia during this hospitalization and she preferred to go ahead and have this Mejia Mejia at this time.  Also, Dr. Fanny Mejia has managed her breast cancer and I discussed with her about having her Mejia by him, but she preferred again Mejia at this time.  The indications, potential complications of hiatal  hernia and Nissen fundoplication were explained to the patient.  I gave her some literature on Nissen fundoplication.  The risks include, but are not limited to, bleeding, infection, nerve injury, recurrence of the hiatal hernia, and perforation of the bowel.  OPERATIVE NOTE:  The patient was taken to room #1 at [facility], underwent a general endotracheal anesthetic supervised by Caroline Mejia.  She was given 2 g of Ancef at the initiation of procedure.  Her arms were tucked.  Her abdomen was prepped with ChloraPrep and sterilely draped.  A time-out was held and the surgical checklist run.  I accessed her abdominal cavity through the left upper quadrant with a 5- mm Optiview trocar and insufflated the abdomen with CO2.  I placed five additional trocars: a 5-mm subxiphoid trocar for the liver retractor, a left lateral 5-mm trocar for my assistant, a 5-mm trocar in the left paramedian for the camera, an 11-mm to the right paramedian for suturing and then a 5-mm right subcostal for my left hand.  The right and left lobes of the liver were unremarkable.  The gallbladder was unremarkable.  The bowel that I could see was unremarkable.  She had no other mass or nodule  within the abdomen.  About half of her stomach was through the hiatus into the posterior chest.  She had a hiatal hernia defect about 5 x 8 cm.  I first dissected out the hernia sac and dissected this out of the mediastinum.  I exposed the length of about 6 cm of esophagus and resected the hernia sac.   There was at least one nodule in the hernia sac that looked benign, but with a history of breast cancer I sent the sac off for pathology.  After dissecting out the hernia sac, I then cleaned up the right and left crus of the diaphragm.  Next, I mobilized the greater curvature going down about one third of the way on the stomach and taking down the vessels along the upper greater curvature and to the short gastrics.  This allowed me to  pull the stomach around for doing a Nissen fundoplication.  Then, I closed the hiatus with interrupted 0 Ethibond sutures.  I used five of these and closed the hiatus down to approximately 3 to 3.5 cm.  At this time, Caroline Mejia passed a 56-French lighted bougie down to this stomach without difficulty and I performed a wrap around the bougie with three sutures of 2-0 Ethibond capturing the left portion of the stomach, some of the esophagus and then the wrapped cardia.  My fundoplication was about 2.5 cm along with three sutures.  The bougie was then removed.  I reinspected the hiatus where the sutures looked good.  I thought that the hiatus closed well and it was not worth placing any kind of biologic mesh to reinforce the diaphragm.  The esophagus looked good without any evidence of injury and the wrap looked good.  I then removed the trocars in turn and infiltrated each skin site with 0.25% Marcaine with epinephrine (30 ml).  The liver retractor was removed under direct visualization.  The skin was closed with 4-0 Monocryl suture, painted with Dermabond.  The patient was transported to the recovery room in good condition.  Sponge and needle counts were correct at the end of the case.  I will plan a swallow on her tomorrow to evaluate the wrap.   Caroline Mejia, M.D., Caroline Mejia, scribe for Epic   DHN/MEDQ  D:  11/23/2014  T:  11/24/2014  Job:  [id number]  cc:   Caroline Mejia, M.D. Fax: [phone]  Caroline Mejia, M.D. Fax: [phone]  Caroline Mejia, M.D. Fax: [phone]  Caroline Mejia, M.D. Fax: [phone]  Caroline Mejia, M.D. Fax: [phone]

## 2014-11-24 NOTE — Progress Notes (Signed)
[facility] Progress Note  1 Day Post-Op  Subjective: Pt doing well, feels a bit sore.  No N/V.  Ambulating well.  Thirsty.  No complaints.  Objective: Vital signs in last 24 hours: Temp:  [97.4 F (36.3 C)-99.4 F (37.4 C)] 99.4 F (37.4 C) (02/16 0515) Pulse Rate:  [61-106] 92 (02/16 0515) Resp:  [16-23] 18 (02/16 0515) BP: (132-163)/(74-100) 144/77 mmHg (02/16 0515) SpO2:  [95 %-100 %] 98 % (02/16 0515) Last BM Date: 11/23/14  Intake/Output from previous day: 02/15 0701 - 02/16 0700 In: 3031.3 [I.V.:3031.3] Out: 3275 [Urine:3250; Blood:25] Intake/Output this shift:    PE: Gen:  Alert, NAD, pleasant Abd: Soft, mild tenderness, ND, +BS, no HSM, incisions C/D/I   Lab Results:  No results for input(s): WBC, HGB, HCT, PLT in the last 72 hours. BMET No results for input(s): NA, K, CL, CO2, GLUCOSE, BUN, CREATININE, CALCIUM in the last 72 hours. PT/INR No results for input(s): LABPROT, INR in the last 72 hours. CMP     Component Value Date/Time   NA 142 11/19/2014 0537   NA 142 09/10/2014 1632   K 3.5 11/19/2014 0537   K 3.8 09/10/2014 1632   CL 108 11/19/2014 0537   CL 106 02/06/2013 1605   CO2 28 11/19/2014 0537   CO2 28 09/10/2014 1632   GLUCOSE 93 11/19/2014 0537   GLUCOSE 73 09/10/2014 1632   GLUCOSE 109* 02/06/2013 1605   BUN 10 11/19/2014 0537   BUN 12.6 09/10/2014 1632   CREATININE 0.87 11/19/2014 0537   CREATININE 1.0 09/10/2014 1632   CALCIUM 9.2 11/19/2014 0537   CALCIUM 9.8 09/10/2014 1632   PROT 7.8 11/18/2014 1519   PROT 7.0 09/10/2014 1632   ALBUMIN 4.5 11/18/2014 1519   ALBUMIN 3.9 09/10/2014 1632   AST 29 11/18/2014 1519   AST 23 09/10/2014 1632   ALT 19 11/18/2014 1519   ALT 21 09/10/2014 1632   ALKPHOS 50 11/18/2014 1519   ALKPHOS 62 09/10/2014 1632   BILITOT 1.0 11/18/2014 1519   BILITOT 0.64 09/10/2014 1632   GFRNONAA 72* 11/19/2014 0537   GFRAA 83* 11/19/2014 0537   Lipase     Component Value Date/Time   LIPASE  49 11/18/2014 1519       Studies/Results: No results found.  Anti-infectives: Anti-infectives    None       Assessment/Plan Large hiatal hernia POD #1 s/p Laparoscopic Nissen fundoplication and repair of hiatal hernia - 11/23/2014 - D. Tyreke Kaeser GERD  Plan: 1.  Obtain swallow today and if okay then start clears 2.  IVF, pain control, antiemetics 3.  Ambulate and IS 4.  SCD's and lovenox   LOS: 3 days    Coralie Keens 11/24/2014, 7:54 AM Pager: [phone]  Doing well. Sister and mother in room. UGI was okay - to start clear liquids.  Alphonsa Overall, MD, [facility] Pager: [phone] Office phone:  [phone]

## 2014-11-25 MED ORDER — OXYCODONE-ACETAMINOPHEN 5-325 MG PO TABS: 1.0000 | ORAL_TABLET | Freq: Four times a day (QID) | ORAL | Status: DC | PRN

## 2014-11-25 NOTE — Discharge Instructions (Signed)
Dr. Lucia Gaskins recommends returning to work in 7-10 days.   LAPAROSCOPIC SURGERY: POST OP INSTRUCTIONS  1. DIET: Follow a light bland diet the first 24 hours after arrival home, such as soup, liquids, crackers, etc. Be sure to include lots of fluids daily. Avoid fast food or heavy meals as your are more likely to get nauseated. Eat a low fat the next few days after surgery.  2. Take your usually prescribed home medications unless otherwise directed. 3. PAIN CONTROL:  1. Pain is best controlled by a usual combination of three different methods TOGETHER:  1. Ice/Heat 2. Over the counter pain medication 3. Prescription pain medication 2. Most patients will experience some swelling and bruising around the incisions. Ice packs or heating pads (30-60 minutes up to 6 times a day) will help. Use ice for the first few days to help decrease swelling and bruising, then switch to heat to help relax tight/sore spots and speed recovery. Some people prefer to use ice alone, heat alone, alternating between ice & heat. Experiment to what works for you. Swelling and bruising can take several weeks to resolve.  3. It is helpful to take an over-the-counter pain medication regularly for the first few weeks. Choose one of the following that works best for you:  1. Naproxen (Aleve, etc) Two 220mg  tabs twice a day 2. Ibuprofen (Advil, etc) Three 200mg  tabs four times a day (every meal & bedtime) 3. Acetaminophen (Tylenol, etc) 500-650mg  four times a day (every meal & bedtime) 4. A prescription for pain medication (such as oxycodone, hydrocodone, etc) should be given to you upon discharge. Take your pain medication as prescribed.  1. If you are having problems/concerns with the prescription medicine (does not control pain, nausea, vomiting, rash, itching, etc), please call us [phone] to see if we need to switch you to a different pain medicine that will work better for you and/or control your side effect better. 2. If  you need a refill on your pain medication, please contact your pharmacy. They will contact our office to request authorization. Prescriptions will not be filled after 5 pm or on week-ends. 4. Avoid getting constipated. Between the surgery and the pain medications, it is common to experience some constipation. Increasing fluid intake and taking a fiber supplement (such as Metamucil, Citrucel, FiberCon, MiraLax, etc) 1-2 times a day regularly will usually help prevent this problem from occurring. A mild laxative (prune juice, Milk of Magnesia, MiraLax, etc) should be taken according to package directions if there are no bowel movements after 48 hours.  5. Watch out for diarrhea. If you have many loose bowel movements, simplify your diet to bland foods & liquids for a few days. Stop any stool softeners and decrease your fiber supplement. Switching to mild anti-diarrheal medications (Kayopectate, Pepto Bismol) can help. If this worsens or does not improve, please call us. 6. Wash / shower every day. You may shower over the dressings as they are waterproof. Continue to shower over incision(s) after the dressing is off. 7. Remove your waterproof bandages 5 days after surgery. You may leave the incision open to air. You may replace a dressing/Band-Aid to cover the incision for comfort if you wish.  8. ACTIVITIES as tolerated:  1. You may resume regular (light) daily activities beginning the next day--such as daily self-care, walking, climbing stairs--gradually increasing activities as tolerated. If you can walk 30 minutes without difficulty, it is safe to try more intense activity such as jogging, treadmill, bicycling, low-impact aerobics,  swimming, etc. 2. Save the most intensive and strenuous activity for last such as sit-ups, heavy lifting, contact sports, etc Refrain from any heavy lifting or straining until you are off narcotics for pain control.  3. DO NOT PUSH THROUGH PAIN. Let pain be your guide: If it  hurts to do something, don't do it. Pain is your body warning you to avoid that activity for another week until the pain goes down. 4. You may drive when you are no longer taking prescription pain medication, you can comfortably wear a seatbelt, and you can safely maneuver your car and apply brakes. 5. You may have sexual intercourse when it is comfortable.  9. FOLLOW UP in our office  1. Please call [facility] at [phone] to set up an appointment to see your surgeon in the office for a follow-up appointment approximately 2-3 weeks after your surgery. 2. Make sure that you call for this appointment the day you arrive home to insure a convenient appointment time.      10. IF YOU HAVE DISABILITY OR FAMILY LEAVE FORMS, BRING THEM TO THE               OFFICE FOR PROCESSING.   WHEN TO CALL us [phone]:  1. Poor pain control 2. Reactions / problems with new medications (rash/itching, nausea, etc)  3. Fever over 101.5 F (38.5 C) 4. Inability to urinate 5. Nausea and/or vomiting 6. Worsening swelling or bruising 7. Continued bleeding from incision. 8. Increased pain, redness, or drainage from the incision  The clinic staff is available to answer your questions during regular business hours (8:30am-5pm). Please dont hesitate to call and ask to speak to one of our nurses for clinical concerns.  If you have a medical emergency, go to the nearest emergency room or call 911.  A surgeon from [facility] is always on call at the [facility], [place], [place], [place], [place] [address] ?  MAIN: [phone] ? TOLL FREE: [phone] ?  FAX [phone]  [email or web address]   Nissen Fundoplication Care After Please read the instructions outlined below and refer to this sheet for the next few weeks. These discharge instructions provide you with general information on caring for yourself after you leave the hospital. Your  doctor may also give you specific instructions. While your treatment has been planned according to the most current medical practices available, unavoidable complications sometimes happen. If you have any problems or questions after discharge, please call your doctor. ACTIVITY  Take frequent rest periods throughout the day.  Take frequent walks throughout the day. This will help to prevent blood clots.  Continue to do your coughing and deep breathing exercises once you get home. This will help to prevent pneumonia.  No strenuous activities such as heavy lifting, pushing or pulling until after your follow-up visit with your doctor. Do not lift anything heavier than 10 pounds.  Talk with your caregiver about when you may return to work and your exercise routine.  You may shower 2 days after surgery. Pat incisions dry. Do not rub incisions with washcloth or towel.  Do not drive while taking prescription pain medication. NUTRITION  Continue with a liquid diet, or the diet you were directed to take, until your first follow-up visit with your surgeon.  Drink fluids (6-8 glasses a day).  Call your caregiver for persistent nausea (feeling sick to your stomach), vomiting, bloating or difficulty swallowing. ELIMINATION It  is very important not to strain during bowel movements. If constipation should occur, you may:  Take a mild laxative (such as Milk of Magnesia).  Add fruit and bran to your diet.  Drink more fluids.  Call your caregiver if constipation is not relieved. FEVER If you feel feverish or have shaking chills, take your temperature. If it is 28 F (38.9 C) or above, call your caregiver. The fever may mean there is an infection. PAIN CONTROL  If a prescription was given for a pain reliever, please follow your caregiver's directions.  Only take over-the-counter or prescription medicines for pain, discomfort, or fever as directed by your caregiver.  If the pain is not  relieved by your medicine, becomes worse, or you have difficulty breathing, call your doctor. INCISION  It is normal for your cuts (incisions) from surgery to have a small amount of drainage for the first 1-2 days. Once the drainage has stopped, leave your incision(s) open to air.  Check your incision(s) and surrounding area daily for any redness, swelling, increased drainage or bleeding. If any of these are present or if the wound edges start to separate, call your doctor.  If you have small adhesive strips in place, they will peel and fall off. (If these strips are covered with a clear bandage, your doctor will tell you when to remove them.)  If you have staples, your caregiver will remove them at the follow-up appointment. Document Released: 05/18/2004 Document Revised: 12/18/2011 Document Reviewed: 08/22/2007 [facility] Patient Information 2015 [place], [place]. This information is not intended to replace advice given to you by your health care provider. Make sure you discuss any questions you have with your health care provider.

## 2014-11-25 NOTE — Discharge Summary (Signed)
[facility] Discharge Summary   Patient ID: Caroline Mejia MRN: [id number] DOB/AGE: 11-Feb-1956 59 y.o.  Admit date: 11/18/2014 Discharge date: 11/25/2014  Admitting Diagnosis: Hiatal hernia GERD Breast cancer - right & left  Discharge Diagnosis Patient Active Problem List   Diagnosis Date Noted  . Hiatal hernia 11/18/2014  . GERD (gastroesophageal reflux disease) 03/03/2014  . Breast cancer of upper-inner quadrant of left female breast 06/07/2013  . Breast cancer, right breast 06/07/2013  . Osteopenia 06/03/2013    Consultants None  Imaging: Dg Ugi W/water Sol Cm  11/24/2014   CLINICAL DATA:  Patient status post hernia repair with Nissen fundoplication. Chronic hiatal hernia with 50% of stomach stomach above the diaphragms prior to surgery.  EXAM: UPPER GI SERIES WITHOUT KUB  TECHNIQUE: Routine upper GI series was performed with water-soluble barium.  FLUOROSCOPY TIME:  Radiation Exposure Index (as provided by the fluoroscopic device): NA  If the device does not provide the exposure index:  Fluoroscopy Time (in minutes and seconds):  1 minutes 12 seconds  Number of Acquired Images:  11  COMPARISON:  CT 11/18/2014, upper GI by 924 2015 of  FINDINGS: The stomach has been drawn down beneath the diaphragm. There is a large cavity within the medial left lower hemi thorax at site of prior chronic hernia. Oral contrast flows readily through the esophagus and fundoplication without obstruction. There is mild dilatation of the esophagus / GE junction above the fundoplication wrap.  The stomach does have narrowing within the gastric body related to chronic deformity from the prior hernia.  IMPRESSION: 1. No evidence of leak or obstruction following Nissen fundoplication . 2. Stomach beneath the diaphragms. 3. Large cavity remains at site of prior hernia within the medial left hemi thorax. 4. Distal esophagus / GE junction above the wrap is mildly patulous.   Electronically Signed    By: Suzy Bouchard M.D.   On: 11/24/2014 10:21    Procedures Dr. Lucia Gaskins (11/23/14) - Laparoscopic Nissen fundoplication with repair of hiatal hernia  Hospital Course:  59 y/o white female presented to [facility] for abdominal pain and N/V.  She is a a pt of Dr Darrel Hoover. She is s/p mastectomy and chemotherapy in 2014 for recurrent contralateral breast cancer. She has a known hiatal hernia that Dr Amedeo Plenty is following. She has had worsening pain and nausea with vomiting for the past few weeks. Her latest episode start last night and continued today after lunch with severe vomiting. She was unable to tolerate CT contrast without vomiting. CT showed worsening of her hiatal hernia with lesser curve pulled up above the diaphragm. There is some food that appears trapped in the body of the stomach below the diaphragm. She is not on any steroids, chemotherapy or blood thinners.   Patient was admitted and underwent procedure listed above.  Tolerated procedure well and was transferred to the floor.  On POD #1 an UGI was accomplished and no evidence of leak or obstruction was noted.  Diet was advanced to clears then to fulls.  She has been instructed to continue a full liquid diet for 2 weeks prior to starting solid/soft foods.  We asked the dietitian to see her prior to discharge to review the diet instructions.    On POD #2, the patient was voiding well, tolerating diet, ambulating well, pain well controlled, vital signs stable, incisions c/d/i and felt stable for discharge home.  Patient will follow up in our office in 2-3 weeks and knows to call with  questions or concerns.  She can return to work in 7-10 days.      Medication List    STOP taking these medications        ROLAIDS PO      TAKE these medications        anastrozole 1 MG tablet  Commonly known as:  ARIMIDEX  Take 1 tablet (1 mg total) by mouth daily.     calcium carbonate 600 MG Tabs tablet  Commonly known as:  OS-CAL  Take 600 mg  by mouth at bedtime.     cycloSPORINE 0.05 % ophthalmic emulsion  Commonly known as:  RESTASIS  Place 1 drop into both eyes 2 (two) times daily.     DSS 100 MG Caps  Take 100 mg by mouth daily.     Fish Oil 1000 MG Caps  Take 1,000 mg by mouth at bedtime.     GAS-X PO  Take 1 tablet by mouth daily as needed (indigestion).     omeprazole 40 MG capsule  Commonly known as:  PRILOSEC  Take 40 mg by mouth 2 (two) times daily.     oxyCODONE-acetaminophen 5-325 MG per tablet  Commonly known as:  PERCOCET/ROXICET  Take 1-2 tablets by mouth every 6 (six) hours as needed for moderate pain or severe pain.     promethazine 25 MG tablet  Commonly known as:  PHENERGAN  Take 1 tablet (25 mg total) by mouth every 6 (six) hours as needed for nausea or vomiting.     simvastatin 40 MG tablet  Commonly known as:  ZOCOR  Take 40 mg by mouth at bedtime.         Follow-up Information    Follow up with Donnie Coffin, MD.   Specialty:  Family Medicine   Why:  As needed   Contact information:   [address] [place] [address] [phone]       Follow up with [facility], MD. Schedule an appointment as soon as possible for a visit in 2 weeks.   Specialty:  General Surgery   Why:  For post-operation check with Dr. Lucia Gaskins in 2-3 weeks   Contact information:   [place] [address] [phone]       Signed: Coralie Keens [facility] [phone]  11/25/2014, 9:03 AM  Agree with above.  Alphonsa Overall, MD, [facility] Pager: [phone] Office phone:  [phone]

## 2014-11-25 NOTE — Plan of Care (Signed)
Problem: Food- and Nutrition-Related Knowledge Deficit (NB-1.1) Goal: Nutrition education Formal process to instruct or train a patient/client in a skill or to impart knowledge to help patients/clients voluntarily manage or modify food choices and eating behavior to maintain or improve health. Outcome: Completed/Met Date Met:  11/25/14 Nutrition Education Note  Received consult for diet education for Nissen Fundoplication post-op diet.   Discussed 2 week post op diet with pt and provided handout. Emphasized that liquids must be non carbonated and sugar free. Fluid goals discussed. Teach back method used, pt expressed understanding, expect good compliance.   Diet: First 2 Weeks  If you have severe vomiting or nausea and cannot handle clear liquids lasting longer than 1 day, call your surgeon  Stressed full liquid diet with small, frequent meals. Choose foods that are low in sugar to avoid dumping syndrome. Choose foods that are unlikely to cause indigestion.  Goal for protein each day:  Men = 80 grams per day  Women = 60 grams per day  Protein powder may be added to fluids such as non-fat milk or Lactaid milk or Soy milk  Hydration  Slowly increase the amount of water and other clear liquids as tolerated (See Acceptable Fluids)  Slowly increase the amount of protein shake as tolerated  Sip fluids slowly and throughout the day  May use sugar substitutes in small amounts  32 oz (or more) should be clear liquids  And  32 oz (or more) should be full liquids (see below for examples)  Liquids should not contain sugar or carbonation   Clear Liquids:  Water or Sugar-free flavored water (i.e. Fruit H2O, Propel)  Decaffeinated coffee or tea (sugar-free)  Crystal Lite, Wyler's Lite, Minute Maid Lite  Sugar-free Jell-O  Bouillon or broth  Sugar-free Popsicle  Full Liquids:  Protein Shakes/Drinks Strained cream soup  Milk or Lactaid Milk  Yogurt Grits  Laurette Schimke MS, RD, LDN

## 2014-11-25 NOTE — Progress Notes (Signed)
General Surgery Note  LOS: 4 days  POD -  2 Days Post-Op  Assessment/Plan: 1.  LAPAROSCOPIC NISSEN FUNDOPLICATION - 9/39/0300 - D[employer]  Doing well.  To stay on full liquids for 2 weeks, then can add solids back.  May return to work in 3 to 10 days.  See me in 2 to 3 weeks.  2.  Right breast cancer - pT1b NX invasive ductal carcinoma - right mastectomy, TRAM reconstruction 11/2006 3.  Left breast cancer - pT1c pN0, stage IA invasive ductal carcinoma - left mastectomy, Latissimus reconstruction 01/2013  Followed by Dr. Marcelo Baldy  On anastrazole  4.  DVT prophylaxis - Lovenox   Active Problems:   Hiatal hernia  Subjective:  Did well with liquids.  To advance diet to full liquids, then home after lunch. Objective:   Filed Vitals:   11/25/14 0546  BP: 147/90  Pulse: 99  Temp: 99 F (37.2 C)  Resp: 16     Intake/Output from previous day:  02/16 0701 - 02/17 0700 In: 2139.2 [P.O.:360; I.V.:1779.2] Out: 3150 [Urine:3150]  Intake/Output this shift:      Physical Exam:   General: WN Wf who is alert and oriented.    HEENT: Normal. Pupils equal. .   Lungs: Clear.   Abdomen: Soft   Wound: Clean     Lab Results:   No results for input(s): WBC, HGB, HCT, PLT in the last 72 hours.  BMET  No results for input(s): NA, K, CL, CO2, GLUCOSE, BUN, CREATININE, CALCIUM in the last 72 hours.  PT/INR  No results for input(s): LABPROT, INR in the last 72 hours.  ABG  No results for input(s): PHART, HCO3 in the last 72 hours.  Invalid input(s): PCO2, PO2   Studies/Results:  Dg Ugi W/water Sol Cm  11/24/2014   CLINICAL DATA:  Patient status post hernia repair with Nissen fundoplication. Chronic hiatal hernia with 50% of stomach stomach above the diaphragms prior to surgery.  EXAM: UPPER GI SERIES WITHOUT KUB  TECHNIQUE: Routine upper GI series was performed with water-soluble barium.  FLUOROSCOPY TIME:  Radiation Exposure Index (as provided by the fluoroscopic device): NA  If  the device does not provide the exposure index:  Fluoroscopy Time (in minutes and seconds):  1 minutes 12 seconds  Number of Acquired Images:  11  COMPARISON:  CT 11/18/2014, upper GI by 924 2015 of  FINDINGS: The stomach has been drawn down beneath the diaphragm. There is a large cavity within the medial left lower hemi thorax at site of prior chronic hernia. Oral contrast flows readily through the esophagus and fundoplication without obstruction. There is mild dilatation of the esophagus / GE junction above the fundoplication wrap.  The stomach does have narrowing within the gastric body related to chronic deformity from the prior hernia.  IMPRESSION: 1. No evidence of leak or obstruction following Nissen fundoplication . 2. Stomach beneath the diaphragms. 3. Large cavity remains at site of prior hernia within the medial left hemi thorax. 4. Distal esophagus / GE junction above the wrap is mildly patulous.   Electronically Signed   By: Suzy Bouchard M.D.   On: 11/24/2014 10:21     Anti-infectives:   Anti-infectives    None      Alphonsa Overall, MD, FACS Pager: [phone] Surgery Office: [phone] 11/25/2014

## 2015-02-10 ENCOUNTER — Other Ambulatory Visit: Payer: Self-pay | Admitting: *Deleted

## 2015-02-10 DIAGNOSIS — C50212 Malignant neoplasm of upper-inner quadrant of left female breast: Secondary | ICD-10-CM

## 2015-02-11 ENCOUNTER — Other Ambulatory Visit (HOSPITAL_BASED_OUTPATIENT_CLINIC_OR_DEPARTMENT_OTHER): Payer: 59

## 2015-02-11 DIAGNOSIS — C50212 Malignant neoplasm of upper-inner quadrant of left female breast: Secondary | ICD-10-CM | POA: Diagnosis not present

## 2015-02-11 LAB — CBC WITH DIFFERENTIAL/PLATELET
BASO%: 0.5 % (ref 0.0–2.0)
Basophils Absolute: 0 10*3/uL (ref 0.0–0.1)
EOS ABS: 0.1 10*3/uL (ref 0.0–0.5)
EOS%: 1.6 % (ref 0.0–7.0)
HEMATOCRIT: 46.2 % (ref 34.8–46.6)
HGB: 15.6 g/dL (ref 11.6–15.9)
LYMPH%: 41.4 % (ref 14.0–49.7)
MCH: 30.4 pg (ref 25.1–34.0)
MCHC: 33.8 g/dL (ref 31.5–36.0)
MCV: 89.9 fL (ref 79.5–101.0)
MONO#: 0.5 10*3/uL (ref 0.1–0.9)
MONO%: 8.6 % (ref 0.0–14.0)
NEUT#: 2.6 10*3/uL (ref 1.5–6.5)
NEUT%: 47.9 % (ref 38.4–76.8)
PLATELETS: ADEQUATE 10*3/uL (ref 145–400)
RBC: 5.14 10*6/uL (ref 3.70–5.45)
RDW: 12.6 % (ref 11.2–14.5)
WBC: 5.5 10*3/uL (ref 3.9–10.3)
lymph#: 2.3 10*3/uL (ref 0.9–3.3)

## 2015-02-11 LAB — COMPREHENSIVE METABOLIC PANEL (CC13)
ALT: 26 U/L (ref 0–55)
AST: 25 U/L (ref 5–34)
Albumin: 4 g/dL (ref 3.5–5.0)
Alkaline Phosphatase: 56 U/L (ref 40–150)
Anion Gap: 12 mEq/L — ABNORMAL HIGH (ref 3–11)
BILIRUBIN TOTAL: 0.93 mg/dL (ref 0.20–1.20)
BUN: 12.6 mg/dL (ref 7.0–26.0)
CO2: 26 meq/L (ref 22–29)
Calcium: 10 mg/dL (ref 8.4–10.4)
Chloride: 106 mEq/L (ref 98–109)
Creatinine: 0.9 mg/dL (ref 0.6–1.1)
EGFR: 74 mL/min/{1.73_m2} — ABNORMAL LOW (ref >90)
Glucose: 93 mg/dl (ref 70–140)
Potassium: 4.1 mEq/L (ref 3.5–5.1)
Sodium: 144 mEq/L (ref 136–145)
Total Protein: 7.1 g/dL (ref 6.4–8.3)

## 2015-02-18 ENCOUNTER — Ambulatory Visit (HOSPITAL_BASED_OUTPATIENT_CLINIC_OR_DEPARTMENT_OTHER): Payer: 59 | Admitting: Nurse Practitioner

## 2015-02-18 ENCOUNTER — Encounter: Payer: Self-pay | Admitting: Nurse Practitioner

## 2015-02-18 ENCOUNTER — Telehealth: Payer: Self-pay | Admitting: Oncology

## 2015-02-18 ENCOUNTER — Other Ambulatory Visit: Payer: Self-pay | Admitting: *Deleted

## 2015-02-18 VITALS — BP 134/82 | HR 80 | Temp 98.4°F | Resp 18 | Ht 62.0 in | Wt 135.6 lb

## 2015-02-18 DIAGNOSIS — M8580 Other specified disorders of bone density and structure, unspecified site: Secondary | ICD-10-CM

## 2015-02-18 DIAGNOSIS — C50212 Malignant neoplasm of upper-inner quadrant of left female breast: Secondary | ICD-10-CM

## 2015-02-18 MED ORDER — ANASTROZOLE 1 MG PO TABS: 1.0000 mg | ORAL_TABLET | Freq: Every day | ORAL | Status: DC

## 2015-02-18 NOTE — Telephone Encounter (Signed)
Gave avs & calendar for [place], & May 2017

## 2015-02-18 NOTE — Progress Notes (Signed)
ID: Caroline Mejia   DOB: 01-16-1956  MR#: [id number]  XQJ#:[id number]  PCP: Donnie Coffin, MD GYN:  SU: Fanny Skates OTHER MD: Crissie Reese, Tyler Pita, Teena Irani  CHIEF COMPLAINT:  Bilateral Breast Cancers  CURRENT TREATMENT: Anastrozole   HISTORY OF PRESENT ILLNESS: From the earlier treatment summary. Caroline Mejia was followed by my former partner, [name], for a right-sided breast cancer dating back to March of 2006. I have summarized that data below. More recently, routine screening mammography 11/25/2012 at the breast Center showed a possible mass in the left breast. Diagnostic left mammogram and ultrasonography 12/05/2012 showed a persistent density in the upper inner left breast, which was not palpable by exam. Ultrasound confirmed a vertically oriented hypoechoic mass measuring 0.9 cm. The left axilla was benign.  Biopsy of the left breast mass 12/11/2012 showed ([id number]) an invasive ductal carcinoma, low-grade, estrogen and progesterone receptors both positive at 100%, with an MIB-1 of 8%, and no HER-2 amplification by CISH. On 01/28/2013 the patient underwent left mastectomy with sentinel lymph node sampling. The final pathology ([id number]) showed an invasive ductal carcinoma, grade 2, maximal size 0.9 cm, with both sentinel lymph nodes negative, and ample margins.  The patient's subsequent history is as detailed below.  INTERVAL HISTORY: Caroline Mejia returns today for follow-up of her breast cancer. She has been on anastrozole since August 2014 and is tolerating this drug well. She denies hot flashes, vaginal changes, or arthralgias/myalgias. The interval history is remarkable for a hiatal hernia repair in February. She healed well from this. She does have some continued belching and gas, but no abdominal pain.   REVIEW OF SYSTEMS: Caroline Mejia denies fevers, chills, nausea, vomiting, or changes in bowel or bladder habits. Her reflux is much improved since the surgery.  She is not exercising much but she does have a pedometer and she tries to achieve 5000 steps and walks some at work. She denies shortness of breath, chest pain, cough, or palpitations. She has no headaches, dizziness, vision changes or weakness. A detailed review of systems is otherwise stable.   PAST MEDICAL HISTORY: Past Medical History  Diagnosis Date  . GERD (gastroesophageal reflux disease)   . H/O hiatal hernia   . Breast cancer     bilaterally; "have had chemo & radiation on right side only"  (01/28/2013)    PAST SURGICAL HISTORY: Past Surgical History  Procedure Laterality Date  . Tubal ligation  1983  . Reconstruction breast w/ tram flap Right 2008  . Breast reduction surgery Left 2009  . Breast lumpectomy Right 2006  . Breast surgery    . Breast biopsy Right 2006; 2008  . Breast biopsy Left 12/2012  . Mastectomy Right 2008  . Mastectomy Left 01/28/2013  . Reconstruction breast w/ latissimus dorsi flap Left 01/28/2013  . Mastectomy w/ sentinel node biopsy Left 01/28/2013    Procedure: LEFT TOTAL MASTECTOMY WITH SENTINEL LYMPH NODE BIOPSY;  Surgeon: Adin Hector, MD;  Location: [facility];  Service: General;  Laterality: Left;  . Latissimus flap to breast Left 01/28/2013    Procedure: LEFT LATISSIMUS FLAP TO BREAST WITH SALINE IMPLANT;  Surgeon: Crissie Reese, MD;  Location: [facility];  Service: Plastics;  Laterality: Left;  . Esophagogastroduodenoscopy N/A 11/19/2014    Procedure: ESOPHAGOGASTRODUODENOSCOPY (EGD);  Surgeon: Cleotis Nipper, MD;  Location: [facility];  Service: Endoscopy;  Laterality: N/A;  . Laparoscopic nissen fundoplication N/A 6/31/4970    Procedure: LAPAROSCOPIC NISSEN FUNDOPLICATION;  Surgeon: Alphonsa Overall, MD;  Location: [facility];  Service: General;  Laterality: N/A;    FAMILY HISTORY Family History  Problem Relation Age of Onset  . Heart disease Mother   . Heart disease Father   . Cancer Father    the patient's father died from congestive heart failure at  the age of 33. The patient's mother is currently 61 years old. The patient has one brother, 2 sisters. There is no history of breast or ovarian cancer in the family.  GYNECOLOGIC HISTORY:  (reviewed 03/03/2014)  Menarche age 17, first live birth age 7. The patient is GX P2. She went through menopause with her first chemotherapy, in early 2006. She never took hormone replacement.  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 At home she lives with her 59 y/o mother. They share a cat. Daughter Caroline Mejia works as a Scientist, clinical (histocompatibility and immunogenetics) for a [employer]. Son Caroline Mejia works as a Government social research officer in [place]. He is about to have his fourth child. The patient has 6 grandchildren. The oldest will be going to [employer] fall of 2016 She attends [employer]: Not in place but were discussed with the patient May 2014. She is considering name in her daughter as healthcare power of attorney  HEALTH MAINTENANCE:  (updated 03/03/2014)  History  Substance Use Topics  . Smoking status: Never Smoker   . Smokeless tobacco: Never Used  . Alcohol Use: 0.0 oz/week     Comment: 01/28/2013 "glass of wine ~ once/month"     Colonoscopy: 2008/ [facility]  PAP:  August 2014/[facility]   Bone density:  July 2014, osteopenia with a T score of -2.1   Lipid panel: not on file/[facility]  No Known Allergies  Current Outpatient Prescriptions  Medication Sig Dispense Refill  . anastrozole (ARIMIDEX) 1 MG tablet Take 1 tablet (1 mg total) by mouth daily. 90 tablet 3  . calcium carbonate (OS-CAL) 600 MG TABS Take 600 mg by mouth at bedtime.     . cycloSPORINE (RESTASIS) 0.05 % ophthalmic emulsion Place 1 drop into both eyes 2 (two) times daily.    . Omega-3 Fatty Acids (FISH OIL) 1000 MG CAPS Take 1,000 mg by mouth at bedtime.    Marland Kitchen omeprazole (PRILOSEC) 40 MG capsule Take 40 mg by mouth every other day.     . simvastatin  (ZOCOR) 40 MG tablet Take 40 mg by mouth at bedtime.      . docusate sodium 100 MG CAPS Take 100 mg by mouth daily. (Patient not taking: Reported on 11/18/2014) 30 capsule 0  . Simethicone (GAS-X PO) Take 1 tablet by mouth daily as needed (indigestion).     No current facility-administered medications for this visit.    OBJECTIVE: Middle-aged white woman in no acute distress Filed Vitals:   02/18/15 1305  BP: 134/82  Pulse: 80  Temp: 98.4 F (36.9 C)  Resp: 18  Body mass index is 24.8 kg/(m^2).  ECOG:  0 Filed Weights   02/18/15 1305  Weight: 135 lb 9.6 oz (61.508 kg)   Skin: warm, dry  HEENT: sclerae anicteric, conjunctivae pink, oropharynx clear. No thrush or mucositis.  Lymph Nodes: No cervical or supraclavicular lymphadenopathy  Lungs: clear to auscultation bilaterally, no rales, wheezes, or rhonci  Heart: regular rate and rhythm  Abdomen: round, soft, non tender, positive bowel sounds  Musculoskeletal: No focal spinal tenderness, no peripheral edema  Neuro: non focal, well oriented, positive affect  Breasts: bilateral  breast status post flap reconstruction. Bilateral axillae benign. No evidence of recurrent disease.   LAB RESULTS: Lab Results  Component Value Date   WBC 5.5 02/11/2015   NEUTROABS 2.6 02/11/2015   HGB 15.6 02/11/2015   HCT 46.2 02/11/2015   MCV 89.9 02/11/2015   PLT Clumped Platelets--Appears Adequate 02/11/2015      Chemistry      Component Value Date/Time   NA 144 02/11/2015 0830   NA 142 11/19/2014 0537   K 4.1 02/11/2015 0830   K 3.5 11/19/2014 0537   CL 108 11/19/2014 0537   CL 106 02/06/2013 1605   CO2 26 02/11/2015 0830   CO2 28 11/19/2014 0537   BUN 12.6 02/11/2015 0830   BUN 10 11/19/2014 0537   CREATININE 0.9 02/11/2015 0830   CREATININE 0.87 11/19/2014 0537      Component Value Date/Time   CALCIUM 10.0 02/11/2015 0830   CALCIUM 9.2 11/19/2014 0537   ALKPHOS 56 02/11/2015 0830   ALKPHOS 50 11/18/2014 1519   AST 25 02/11/2015  0830   AST 29 11/18/2014 1519   ALT 26 02/11/2015 0830   ALT 19 11/18/2014 1519   BILITOT 0.93 02/11/2015 0830   BILITOT 1.0 11/18/2014 1519      STUDIES: No results found.  ASSESSMENT: 59 y.o. BRCA negative [place] woman  (1) status post right breast biopsy 12/07/2004 for a clinical T2 N0, stage IIA invasive ductal carcinoma, grade 3, initially read as triple negative  (2) treated neoadjuvantly with cyclophosphamide and doxorubicin in dose dense fashion x2, followed by fluorouracil, epirubicin and cyclophosphamide in dose dense fashion x2, followed by Taxotere x4, completed 04/20/2005.  (3) status post right mastectomy with sentinel lymph node sampling 05/11/2005 for a residual pT1c pN0 invasive ductal carcinoma, grade 3, again estrogen and progesterone receptor negative, with an MIB-1 of 52%, but HER-2 amplified by FISH with a ratio of 3.75  (4) adjuvant radiation completed 08/02/2005  (5) recurrence to the right breast documented February of 2008, status post right mastectomy 12/27/2006 for a pT1b NX invasive ductal carcinoma, again estrogen and progesterone receptor negative, with an MIB-1 of 64%, and HER-2 amplified by fish with a ratio of 3.58.  (6) adjuvant chemotherapy with weekly carboplatin (x7), Abraxane (x9)  and trastuzumab completed 05/08/2007. Trastuzumab was continued to May 2009, to total one year  (7) left mastectomy with sentinel lymph node sampling 01/28/2013 for a pT1c pN0, stage IA invasive ductal carcinoma, grade 1, estrogen and progesterone receptor both 100% positive, with an MIB-1 of 8%, and no HER-2 amplification  (8) status post remote TRAM flap reconstruction on the right, latissimus flap reconstruction on the left  (9) started anastrozole, August 2014, the goal being to continue for a total of 5 years  (a)  Osteopenia, with bone density 04/29/2013 showing a lowest T score of -2.1  (b) started zoledronic acid 06/03/2013, to be repeated yearly while  on anastrozole   PLAN: Eddy continues to do well as far as her breast cancer is concerned. She is now 2 years out from her left mastectomy with no evidence of recurrent disease. The labs were reviewed in detail and were entirely stable. She is tolerating the anastrozole well and will continue this drug for 5 years of antiestrogen therapy.   Michiko will be due for a repeat bone density scan in July, and zometa in August. I have placed orders for both of these today.   She will return in 1 year for labs and a follow  up visit. She understands and agrees with this plan. She knows the goal of treatment in her case is cure. She has been encouraged to call with any issues that might arise before her next visi there.   Laurie Panda, NP   02/18/2015

## 2015-05-03 ENCOUNTER — Ambulatory Visit
Admission: RE | Admit: 2015-05-03 | Discharge: 2015-05-03 | Disposition: A | Discharge status: Home / Self Care | Payer: 59 | Source: Ambulatory Visit | Attending: Nurse Practitioner | Admitting: Nurse Practitioner

## 2015-05-03 DIAGNOSIS — M8580 Other specified disorders of bone density and structure, unspecified site: Secondary | ICD-10-CM

## 2015-05-26 ENCOUNTER — Ambulatory Visit (HOSPITAL_BASED_OUTPATIENT_CLINIC_OR_DEPARTMENT_OTHER): Payer: 59

## 2015-05-26 VITALS — BP 114/74 | HR 75 | Temp 98.3°F | Resp 16

## 2015-05-26 DIAGNOSIS — M8580 Other specified disorders of bone density and structure, unspecified site: Secondary | ICD-10-CM | POA: Diagnosis not present

## 2015-05-26 MED ORDER — SODIUM CHLORIDE 0.9 % IV SOLN
Freq: Once | INTRAVENOUS | Status: AC
  Administered 2015-05-26: 08:00:00 via INTRAVENOUS

## 2015-05-26 MED ORDER — ZOLEDRONIC ACID 4 MG/100ML IV SOLN
4.0000 mg | Freq: Once | INTRAVENOUS | Status: AC
  Administered 2015-05-26: 4 mg via INTRAVENOUS
  Filled 2015-05-26: qty 100

## 2015-05-26 NOTE — Patient Instructions (Signed)
[facility] Discharge Instructions for Patients Today you received the following agents Zometa.  Please keep your regularly scheduled appointments.    If you develop any of the following symptoms and they are not resolved by prescribed medication, call the clinic.   BELOW ARE SYMPTOMS THAT SHOULD BE REPORTED IMMEDIATELY:  *FEVER GREATER THAN 100.5 F  *CHILLS WITH OR WITHOUT FEVER  NAUSEA AND VOMITING THAT IS NOT CONTROLLED WITH YOUR NAUSEA MEDICATION  *UNUSUAL SHORTNESS OF BREATH  *UNUSUAL BRUISING OR BLEEDING  TENDERNESS IN MOUTH AND THROAT WITH OR WITHOUT PRESENCE OF ULCERS  *URINARY PROBLEMS  *BOWEL PROBLEMS  UNUSUAL RASH Items with * indicate a potential emergency and should be followed up as soon as possible.  Feel free to call the clinic you have any questions or concerns. The clinic phone number is [phone].     Zoledronic Acid injection (Hypercalcemia, Oncology) What is this medicine? ZOLEDRONIC ACID (ZOE le dron ik AS id) lowers the amount of calcium loss from bone. It is used to treat too much calcium in your blood from cancer. It is also used to prevent complications of cancer that has spread to the bone. This medicine may be used for other purposes; ask your health care provider or pharmacist if you have questions. What should I tell my health care provider before I take this medicine? They need to know if you have any of these conditions: -aspirin-sensitive asthma -dental disease -kidney disease -an unusual or allergic reaction to zoledronic acid, other medicines, foods, dyes, or preservatives -pregnant or trying to get pregnant -breast-feeding How should I use this medicine? This medicine is for infusion into a vein. It is given by a health care professional in a hospital or clinic setting. Talk to your pediatrician regarding the use of this medicine in children. Special care may be needed. Overdosage: If you think you have taken too  much of this medicine contact a poison control center or emergency room at once. NOTE: This medicine is only for you. Do not share this medicine with others. What if I miss a dose? It is important not to miss your dose. Call your doctor or health care professional if you are unable to keep an appointment. What may interact with this medicine? -certain antibiotics given by injection -NSAIDs, medicines for pain and inflammation, like ibuprofen or naproxen -some diuretics like bumetanide, furosemide -teriparatide -thalidomide This list may not describe all possible interactions. Give your health care provider a list of all the medicines, herbs, non-prescription drugs, or dietary supplements you use. Also tell them if you smoke, drink alcohol, or use illegal drugs. Some items may interact with your medicine. What should I watch for while using this medicine? Visit your doctor or health care professional for regular checkups. It may be some time before you see the benefit from this medicine. Do not stop taking your medicine unless your doctor tells you to. Your doctor may order blood tests or other tests to see how you are doing. Women should inform their doctor if they wish to become pregnant or think they might be pregnant. There is a potential for serious side effects to an unborn child. Talk to your health care professional or pharmacist for more information. You should make sure that you get enough calcium and vitamin D while you are taking this medicine. Discuss the foods you eat and the vitamins you take with your health care professional. Some people who take this medicine have severe bone, joint, and/or  muscle pain. This medicine may also increase your risk for a broken thigh bone. Tell your doctor right away if you have pain in your upper leg or groin. Tell your doctor if you have any pain that does not go away or that gets worse. What side effects may I notice from receiving this  medicine? Side effects that you should report to your doctor or health care professional as soon as possible: -allergic reactions like skin rash, itching or hives, swelling of the face, lips, or tongue -anxiety, confusion, or depression -breathing problems -changes in vision -feeling faint or lightheaded, falls -jaw burning, cramping, pain -muscle cramps, stiffness, or weakness -trouble passing urine or change in the amount of urine Side effects that usually do not require medical attention (report to your doctor or health care professional if they continue or are bothersome): -bone, joint, or muscle pain -fever -hair loss -irritation at site where injected -loss of appetite -nausea, vomiting -stomach upset -tired This list may not describe all possible side effects. Call your doctor for medical advice about side effects. You may report side effects to FDA at 1-800-FDA-1088. Where should I keep my medicine? This drug is given in a hospital or clinic and will not be stored at home. NOTE: This sheet is a summary. It may not cover all possible information. If you have questions about this medicine, talk to your doctor, pharmacist, or health care provider.  2013, [employer]vier/Gold Standard. (03/24/2011 9:06:58 AM)

## 2016-02-15 ENCOUNTER — Other Ambulatory Visit: Payer: Self-pay

## 2016-02-15 DIAGNOSIS — C50212 Malignant neoplasm of upper-inner quadrant of left female breast: Secondary | ICD-10-CM

## 2016-02-16 ENCOUNTER — Other Ambulatory Visit (HOSPITAL_BASED_OUTPATIENT_CLINIC_OR_DEPARTMENT_OTHER): Payer: 59

## 2016-02-16 ENCOUNTER — Telehealth: Payer: Self-pay | Admitting: Oncology

## 2016-02-16 ENCOUNTER — Ambulatory Visit (HOSPITAL_BASED_OUTPATIENT_CLINIC_OR_DEPARTMENT_OTHER): Payer: 59 | Admitting: Oncology

## 2016-02-16 VITALS — BP 149/83 | HR 80 | Temp 98.2°F | Resp 18 | Ht 62.0 in | Wt 142.7 lb

## 2016-02-16 DIAGNOSIS — M8580 Other specified disorders of bone density and structure, unspecified site: Secondary | ICD-10-CM

## 2016-02-16 DIAGNOSIS — C50212 Malignant neoplasm of upper-inner quadrant of left female breast: Secondary | ICD-10-CM

## 2016-02-16 DIAGNOSIS — Z853 Personal history of malignant neoplasm of breast: Secondary | ICD-10-CM | POA: Diagnosis not present

## 2016-02-16 DIAGNOSIS — C50911 Malignant neoplasm of unspecified site of right female breast: Secondary | ICD-10-CM

## 2016-02-16 LAB — COMPREHENSIVE METABOLIC PANEL
ALBUMIN: 3.8 g/dL (ref 3.5–5.0)
ALK PHOS: 48 U/L (ref 40–150)
ALT: 21 U/L (ref 0–55)
AST: 23 U/L (ref 5–34)
Anion Gap: 8 mEq/L (ref 3–11)
BUN: 9.6 mg/dL (ref 7.0–26.0)
CO2: 28 mEq/L (ref 22–29)
CREATININE: 0.9 mg/dL (ref 0.6–1.1)
Calcium: 9.6 mg/dL (ref 8.4–10.4)
Chloride: 107 mEq/L (ref 98–109)
EGFR: 68 mL/min/{1.73_m2} — ABNORMAL LOW (ref >90)
Glucose: 94 mg/dl (ref 70–140)
Potassium: 3.8 mEq/L (ref 3.5–5.1)
Sodium: 143 mEq/L (ref 136–145)
Total Bilirubin: 1.05 mg/dL (ref 0.20–1.20)
Total Protein: 6.9 g/dL (ref 6.4–8.3)

## 2016-02-16 LAB — CBC WITH DIFFERENTIAL/PLATELET
BASO%: 0.8 % (ref 0.0–2.0)
BASOS ABS: 0 10*3/uL (ref 0.0–0.1)
EOS%: 1.6 % (ref 0.0–7.0)
Eosinophils Absolute: 0.1 10*3/uL (ref 0.0–0.5)
HEMATOCRIT: 46.9 % — AB (ref 34.8–46.6)
HEMOGLOBIN: 15.4 g/dL (ref 11.6–15.9)
LYMPH#: 2 10*3/uL (ref 0.9–3.3)
LYMPH%: 35.4 % (ref 14.0–49.7)
MCH: 29.7 pg (ref 25.1–34.0)
MCHC: 32.8 g/dL (ref 31.5–36.0)
MCV: 90.8 fL (ref 79.5–101.0)
MONO#: 0.5 10*3/uL (ref 0.1–0.9)
MONO%: 8.9 % (ref 0.0–14.0)
NEUT#: 3 10*3/uL (ref 1.5–6.5)
NEUT%: 53.3 % (ref 38.4–76.8)
Platelets: 254 10*3/uL (ref 145–400)
RBC: 5.17 10*6/uL (ref 3.70–5.45)
RDW: 13.2 % (ref 11.2–14.5)
WBC: 5.6 10*3/uL (ref 3.9–10.3)

## 2016-02-16 MED ORDER — ANASTROZOLE 1 MG PO TABS: 1.0000 mg | ORAL_TABLET | Freq: Every day | ORAL | Status: DC

## 2016-02-16 NOTE — Progress Notes (Signed)
ID: Caroline Mejia   DOB: 1956-01-15  MR#: [id number]  YIF#:[id number]  PCP: Donnie Coffin, MD GYN:  SU: Fanny Skates OTHER MD: Crissie Reese, Tyler Pita, Teena Irani  CHIEF COMPLAINT:  Bilateral Breast Cancers  CURRENT TREATMENT: Anastrozole   HISTORY OF PRESENT ILLNESS: From the earlier treatment summary.  Caroline Mejia was followed by my former partner, [name], for a right-sided breast cancer dating back to March of 2006. I have summarized that data below. More recently, routine screening mammography 11/25/2012 at the [facility] Center showed a possible mass in the left breast. Diagnostic left mammogram and ultrasonography 12/05/2012 showed a persistent density in the upper inner left breast, which was not palpable by exam. Ultrasound confirmed a vertically oriented hypoechoic mass measuring 0.9 cm. The left axilla was benign.  Biopsy of the left breast mass 12/11/2012 showed ([id number]) an invasive ductal carcinoma, low-grade, estrogen and progesterone receptors both positive at 100%, with an MIB-1 of 8%, and no HER-2 amplification by CISH. On 01/28/2013 the patient underwent left mastectomy with sentinel lymph node sampling. The final pathology ([id number]) showed an invasive ductal carcinoma, grade 2, maximal size 0.9 cm, with both sentinel lymph nodes negative, and ample margins.  The patient's subsequent history is as detailed below.  INTERVAL HISTORY: [name] returns today for follow-up of her estrogen receptor positive breast cancer. She continues on anastrozole, with excellent tolerance. She has occasional hot flashes. Rarely she has mild aches and pains and a little bit of fatigue. She obtains it at less than a dollar a month  REVIEW OF SYSTEMS: Caroline Mejia walks about 45 minutes a day, usually at work. She is also working in her garden and enjoys having her grandchildren her port placement. She still has problems with heartburn. Aside from these issues a detailed review  of systems today was noncontributory  PAST MEDICAL HISTORY: Past Medical History  Diagnosis Date  . GERD (gastroesophageal reflux disease)   . H/O hiatal hernia   . Breast cancer     bilaterally; "have had chemo & radiation on right side only"  (01/28/2013)    PAST SURGICAL HISTORY: Past Surgical History  Procedure Laterality Date  . Tubal ligation  1983  . Reconstruction breast w/ tram flap Right 2008  . Breast reduction surgery Left 2009  . Breast lumpectomy Right 2006  . Breast surgery    . Breast biopsy Right 2006; 2008  . Breast biopsy Left 12/2012  . Mastectomy Right 2008  . Mastectomy Left 01/28/2013  . Reconstruction breast w/ latissimus dorsi flap Left 01/28/2013  . Mastectomy w/ sentinel node biopsy Left 01/28/2013    Procedure: LEFT TOTAL MASTECTOMY WITH SENTINEL LYMPH NODE BIOPSY;  Surgeon: Adin Hector, MD;  Location: [facility];  Service: General;  Laterality: Left;  . Latissimus flap to breast Left 01/28/2013    Procedure: LEFT LATISSIMUS FLAP TO BREAST WITH SALINE IMPLANT;  Surgeon: Crissie Reese, MD;  Location: [facility];  Service: Plastics;  Laterality: Left;  . Esophagogastroduodenoscopy N/A 11/19/2014    Procedure: ESOPHAGOGASTRODUODENOSCOPY (EGD);  Surgeon: Cleotis Nipper, MD;  Location: [facility];  Service: Endoscopy;  Laterality: N/A;  . Laparoscopic nissen fundoplication N/A [id number]/7096    Procedure: LAPAROSCOPIC NISSEN FUNDOPLICATION;  Surgeon: Alphonsa Overall, MD;  Location: [facility];  Service: General;  Laterality: N/A;    FAMILY HISTORY Family History  Problem Relation Age of Onset  . Heart disease Mother   . Heart disease Father   . Cancer Father    the patient's father  died from congestive heart failure at the age of 72. The patient's mother is currently 26 years old. The patient has one brother, 2 sisters. There is no history of breast or ovarian cancer in the family.  GYNECOLOGIC HISTORY:  (reviewed 03/03/2014)  Menarche age 60, first live birth age 60. The  patient is GX P2. She went through menopause with her first chemotherapy, in early 2006. She never took hormone replacement.  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 She is divorced. At home she lives with her 52 y/o mother. They share a cat. Daughter [name] works as a Solicitor for a [employer]. Son [name] works as a Academic librarian in [place]. He is about to have his fourth child. The patient has 6 grandchildren. The oldest will be going to [employer] fall of 2016 She attends [employer]   ADVANCED DIRECTIVES: Not in place but were discussed with the patient May 2014. She is considering name in her daughter as healthcare power of attorney  HEALTH MAINTENANCE:  (updated 03/03/2014)  Social History  Substance Use Topics  . Smoking status: Never Smoker   . Smokeless tobacco: Never Used  . Alcohol Use: 0.0 oz/week     Comment: 01/28/2013 "glass of wine ~ once/month"     Colonoscopy: 2008/ [facility]  PAP:  August 2014/[facility]   Bone density:  July 2014, osteopenia with a T score of -2.1   Lipid panel: not on file/[facility]  No Known Allergies  Current Outpatient Prescriptions  Medication Sig Dispense Refill  . anastrozole (ARIMIDEX) 1 MG tablet Take 1 tablet (1 mg total) by mouth daily. 90 tablet 3  . calcium carbonate (OS-CAL) 600 MG TABS Take 600 mg by mouth at bedtime.     . cycloSPORINE (RESTASIS) 0.05 % ophthalmic emulsion Place 1 drop into both eyes 2 (two) times daily.    Marland Kitchen docusate sodium 100 MG CAPS Take 100 mg by mouth daily. (Patient not taking: Reported on 11/18/2014) 30 capsule 0  . Omega-3 Fatty Acids (FISH OIL) 1000 MG CAPS Take 1,000 mg by mouth at bedtime.    Marland Kitchen omeprazole (PRILOSEC) 40 MG capsule Take 40 mg by mouth every other day.     . Simethicone (GAS-X PO) Take 1 tablet by mouth daily as needed (indigestion).    . simvastatin (ZOCOR) 40 MG tablet Take 40 mg by mouth at bedtime.        No current facility-administered medications for this visit.    OBJECTIVE: Middle-aged white woman who appears well Filed Vitals:   02/16/16 0825  BP: 149/83  Pulse: 80  Temp: 98.2 F (36.8 C)  Resp: 18  Body mass index is 26.09 kg/(m^2).  ECOG:  0 Filed Weights   02/16/16 0825  Weight: 142 lb 11.2 oz (64.728 kg)   Sclerae unicteric, pupils round and equal Oropharynx clear and moist-- no thrush or other lesions No cervical or supraclavicular adenopathy Lungs no rales or rhonchi Heart regular rate and rhythm Abd soft, nontender, positive bowel sounds MSK no focal spinal tenderness, no upper extremity lymphedema Neuro: nonfocal, well oriented, appropriate affect Breasts: Status post bilateral mastectomies with flap reconstruction. There is no evidence of disease recurrence. Both axillae are benign.    LAB RESULTS: Lab Results  Component Value Date   WBC 5.6 02/16/2016   NEUTROABS 3.0 02/16/2016   HGB 15.4 02/16/2016   HCT 46.9* 02/16/2016   MCV 90.8 02/16/2016   PLT 254 02/16/2016  Chemistry      Component Value Date/Time   NA 144 02/11/2015 0830   NA 142 11/19/2014 0537   K 4.1 02/11/2015 0830   K 3.5 11/19/2014 0537   CL 108 11/19/2014 0537   CL 106 02/06/2013 1605   CO2 26 02/11/2015 0830   CO2 28 11/19/2014 0537   BUN 12.6 02/11/2015 0830   BUN 10 11/19/2014 0537   CREATININE 0.9 02/11/2015 0830   CREATININE 0.87 11/19/2014 0537      Component Value Date/Time   CALCIUM 10.0 02/11/2015 0830   CALCIUM 9.2 11/19/2014 0537   ALKPHOS 56 02/11/2015 0830   ALKPHOS 50 11/18/2014 1519   AST 25 02/11/2015 0830   AST 29 11/18/2014 1519   ALT 26 02/11/2015 0830   ALT 19 11/18/2014 1519   BILITOT 0.93 02/11/2015 0830   BILITOT 1.0 11/18/2014 1519      STUDIES: CLINICAL DATA: 60 year old postmenopausal female. The patient has a history of breast cancer diagnoses in 2006, 2008, and 2014. She is currently on Arimidex. The patient reports taking  Reclast for 2 years. The patient takes supplemental calcium.  EXAM: DUAL X-RAY ABSORPTIOMETRY (DXA) FOR BONE MINERAL DENSITY  FINDINGS: AP LUMBAR SPINE L1-L4  Bone Mineral Density (BMD): 0.819 g/cm2  Young Adult T-Score: -2.1  Z-Score: -0.6  LEFT FEMUR NECK  Bone Mineral Density (BMD): 0.626 g/cm2  Young Adult T-Score: -2.0  Z-Score: -0.7  ASSESSMENT: Patient's diagnostic category is LOW BONE MASS by WHO Criteria.  FRACTURE RISK: UNKNOWN  FRAX: World Health Organization FRAX assessment of absolute fracture risk is not calculated for this patient because the patient has a history of bone building therapy.  COMPARISON: When compared to baseline exam on 06/12/2008, there has been a statistically significant -4.9% decrease in bone mineral density of the lumbar spine. There has been no significant change when compared to exam dated 04/29/2013.When compared to baseline exam on 06/12/2008 and previous exam on 04/29/2013, there has been no significant change in bone mineral density of the left total hip.   Follow-up ASSESSMENT: 60 y.o. BRCA negative [place] woman  (1) status post right breast biopsy 12/07/2004 for a clinical T2 N0, stage IIA invasive ductal carcinoma, grade 3, initially read as triple negative  (2) treated neoadjuvantly with cyclophosphamide and doxorubicin in dose dense fashion x2, followed by fluorouracil, epirubicin and cyclophosphamide in dose dense fashion x2, followed by Taxotere x4, completed 04/20/2005.  (3) status post right mastectomy with sentinel lymph node sampling 05/11/2005 for a residual pT1c pN0 invasive ductal carcinoma, grade 3, again estrogen and progesterone receptor negative, with an MIB-1 of 52%, but HER-2 amplified by FISH with a ratio of 3.75  (4) adjuvant radiation completed 08/02/2005  (5) recurrence to the right breast documented February of 2008, status post right mastectomy 12/27/2006 for a pT1b NX  invasive ductal carcinoma, again estrogen and progesterone receptor negative, with an MIB-1 of 64%, and HER-2 amplified by fish with a ratio of 3.58.  (6) adjuvant chemotherapy with weekly carboplatin (x7), Abraxane (x9)  and trastuzumab completed 05/08/2007. Trastuzumab was continued to May 2009, to total one year  (7) left mastectomy with sentinel lymph node sampling 01/28/2013 for a pT1c pN0, stage IA invasive ductal carcinoma, grade 1, estrogen and progesterone receptor both 100% positive, with an MIB-1 of 8%, and no HER-2 amplification  (8) status post remote TRAM flap reconstruction on the right, latissimus flap reconstruction on the left  (9) started anastrozole, August 2014, the goal being to continue for  a total of 5 years  (a)  Osteopenia, with bone density 04/29/2013 showing a lowest T score of -2.1  (b) started zoledronic acid 06/03/2013, to be repeated yearly while on anastrozole  (c) repeat bone density 05/03/2015 stable   PLAN: Dontasia is now 3 years out from definitive surgery for her left breast cancer with no evidence of disease recurrence. This is very favorable.  She is tolerating the anastrozole well and the plan will be to continue that an additional 2 years.  Her bone density is stable. We are going to continue the annual zolendronate and she will receive her next dose on 05/24/2016  Will return to see me in one year. She knows to call for any problems that may develop before her next visit here. Chauncey Cruel, MD   02/16/2016

## 2016-02-16 NOTE — Telephone Encounter (Signed)
appt made and avs printed °

## 2016-04-13 ENCOUNTER — Other Ambulatory Visit (HOSPITAL_COMMUNITY)
Admission: RE | Admit: 2016-04-13 | Discharge: 2016-04-13 | Disposition: A | Discharge status: Home / Self Care | Payer: 59 | Source: Ambulatory Visit | Attending: Family Medicine | Admitting: Family Medicine

## 2016-04-13 ENCOUNTER — Other Ambulatory Visit: Payer: Self-pay | Admitting: Family Medicine

## 2016-04-13 DIAGNOSIS — Z124 Encounter for screening for malignant neoplasm of cervix: Secondary | ICD-10-CM | POA: Diagnosis not present

## 2016-04-17 LAB — CYTOLOGY - PAP

## 2016-05-23 ENCOUNTER — Other Ambulatory Visit: Payer: Self-pay | Admitting: Oncology

## 2016-05-23 ENCOUNTER — Telehealth: Payer: Self-pay | Admitting: *Deleted

## 2016-05-23 NOTE — Telephone Encounter (Signed)
Per pharmacy I have scheduled lab appt and moved treatment by 15 minutes

## 2016-05-24 ENCOUNTER — Ambulatory Visit (HOSPITAL_BASED_OUTPATIENT_CLINIC_OR_DEPARTMENT_OTHER): Payer: 59

## 2016-05-24 ENCOUNTER — Other Ambulatory Visit: Payer: Self-pay | Admitting: *Deleted

## 2016-05-24 ENCOUNTER — Other Ambulatory Visit (HOSPITAL_BASED_OUTPATIENT_CLINIC_OR_DEPARTMENT_OTHER): Payer: 59

## 2016-05-24 VITALS — BP 127/74 | HR 85 | Temp 98.4°F | Resp 16

## 2016-05-24 DIAGNOSIS — M8580 Other specified disorders of bone density and structure, unspecified site: Secondary | ICD-10-CM | POA: Diagnosis not present

## 2016-05-24 DIAGNOSIS — Z79811 Long term (current) use of aromatase inhibitors: Secondary | ICD-10-CM

## 2016-05-24 DIAGNOSIS — C50212 Malignant neoplasm of upper-inner quadrant of left female breast: Secondary | ICD-10-CM | POA: Diagnosis not present

## 2016-05-24 LAB — COMPREHENSIVE METABOLIC PANEL
ALBUMIN: 4 g/dL (ref 3.5–5.0)
ALT: 22 U/L (ref 0–55)
AST: 29 U/L (ref 5–34)
Alkaline Phosphatase: 72 U/L (ref 40–150)
Anion Gap: 11 mEq/L (ref 3–11)
BUN: 12.2 mg/dL (ref 7.0–26.0)
CO2: 25 meq/L (ref 22–29)
Calcium: 10.6 mg/dL — ABNORMAL HIGH (ref 8.4–10.4)
Chloride: 105 mEq/L (ref 98–109)
Creatinine: 0.9 mg/dL (ref 0.6–1.1)
EGFR: 70 mL/min/{1.73_m2} — ABNORMAL LOW (ref >90)
GLUCOSE: 120 mg/dL (ref 70–140)
POTASSIUM: 3.8 meq/L (ref 3.5–5.1)
SODIUM: 141 meq/L (ref 136–145)
Total Bilirubin: 1.12 mg/dL (ref 0.20–1.20)
Total Protein: 7.8 g/dL (ref 6.4–8.3)

## 2016-05-24 LAB — CBC WITH DIFFERENTIAL/PLATELET
BASO%: 0.3 % (ref 0.0–2.0)
Basophils Absolute: 0 10*3/uL (ref 0.0–0.1)
EOS ABS: 0.1 10*3/uL (ref 0.0–0.5)
EOS%: 0.9 % (ref 0.0–7.0)
HCT: 46.5 % (ref 34.8–46.6)
HEMOGLOBIN: 15.9 g/dL (ref 11.6–15.9)
LYMPH%: 38.5 % (ref 14.0–49.7)
MCH: 30.2 pg (ref 25.1–34.0)
MCHC: 34.2 g/dL (ref 31.5–36.0)
MCV: 88.2 fL (ref 79.5–101.0)
MONO#: 0.5 10*3/uL (ref 0.1–0.9)
MONO%: 8 % (ref 0.0–14.0)
NEUT#: 3.3 10*3/uL (ref 1.5–6.5)
NEUT%: 52.3 % (ref 38.4–76.8)
Platelets: 263 10*3/uL (ref 145–400)
RBC: 5.27 10*6/uL (ref 3.70–5.45)
RDW: 12.8 % (ref 11.2–14.5)
WBC: 6.3 10*3/uL (ref 3.9–10.3)
lymph#: 2.4 10*3/uL (ref 0.9–3.3)

## 2016-05-24 MED ORDER — ZOLEDRONIC ACID 4 MG/5ML IV CONC
4.0000 mg | Freq: Once | INTRAVENOUS | Status: AC
  Administered 2016-05-24: 4 mg via INTRAVENOUS
  Filled 2016-05-24: qty 5

## 2016-05-24 NOTE — Patient Instructions (Signed)

## 2016-12-22 ENCOUNTER — Other Ambulatory Visit: Payer: Self-pay | Admitting: Family Medicine

## 2016-12-22 DIAGNOSIS — H3561 Retinal hemorrhage, right eye: Secondary | ICD-10-CM

## 2016-12-29 ENCOUNTER — Ambulatory Visit
Admission: RE | Admit: 2016-12-29 | Discharge: 2016-12-29 | Disposition: A | Discharge status: Home / Self Care | Payer: 59 | Source: Ambulatory Visit | Attending: Family Medicine | Admitting: Family Medicine

## 2016-12-29 DIAGNOSIS — H3561 Retinal hemorrhage, right eye: Secondary | ICD-10-CM

## 2017-01-08 ENCOUNTER — Ambulatory Visit (HOSPITAL_COMMUNITY): Payer: 59 | Attending: Cardiovascular Disease

## 2017-01-08 ENCOUNTER — Other Ambulatory Visit: Payer: Self-pay

## 2017-01-08 DIAGNOSIS — H3561 Retinal hemorrhage, right eye: Secondary | ICD-10-CM

## 2017-02-13 ENCOUNTER — Other Ambulatory Visit: Payer: Self-pay | Admitting: Adult Health

## 2017-02-13 DIAGNOSIS — C50212 Malignant neoplasm of upper-inner quadrant of left female breast: Secondary | ICD-10-CM

## 2017-02-13 DIAGNOSIS — Z170 Estrogen receptor positive status [ER+]: Principal | ICD-10-CM

## 2017-02-14 ENCOUNTER — Ambulatory Visit (HOSPITAL_BASED_OUTPATIENT_CLINIC_OR_DEPARTMENT_OTHER): Payer: 59 | Admitting: Oncology

## 2017-02-14 ENCOUNTER — Other Ambulatory Visit (HOSPITAL_BASED_OUTPATIENT_CLINIC_OR_DEPARTMENT_OTHER): Payer: 59

## 2017-02-14 VITALS — BP 133/80 | HR 68 | Temp 98.2°F | Ht 62.0 in | Wt 143.8 lb

## 2017-02-14 DIAGNOSIS — Z853 Personal history of malignant neoplasm of breast: Secondary | ICD-10-CM | POA: Diagnosis not present

## 2017-02-14 DIAGNOSIS — Z171 Estrogen receptor negative status [ER-]: Secondary | ICD-10-CM

## 2017-02-14 DIAGNOSIS — Z170 Estrogen receptor positive status [ER+]: Secondary | ICD-10-CM

## 2017-02-14 DIAGNOSIS — C50212 Malignant neoplasm of upper-inner quadrant of left female breast: Secondary | ICD-10-CM

## 2017-02-14 DIAGNOSIS — M8580 Other specified disorders of bone density and structure, unspecified site: Secondary | ICD-10-CM | POA: Diagnosis not present

## 2017-02-14 DIAGNOSIS — C50811 Malignant neoplasm of overlapping sites of right female breast: Secondary | ICD-10-CM | POA: Insufficient documentation

## 2017-02-14 LAB — COMPREHENSIVE METABOLIC PANEL
ALT: 20 U/L (ref 0–55)
AST: 25 U/L (ref 5–34)
Albumin: 3.8 g/dL (ref 3.5–5.0)
Alkaline Phosphatase: 55 U/L (ref 40–150)
Anion Gap: 9 mEq/L (ref 3–11)
BUN: 9 mg/dL (ref 7.0–26.0)
CALCIUM: 9.6 mg/dL (ref 8.4–10.4)
CO2: 26 mEq/L (ref 22–29)
Chloride: 108 mEq/L (ref 98–109)
Creatinine: 1 mg/dL (ref 0.6–1.1)
EGFR: 59 mL/min/{1.73_m2} — AB (ref >90)
Glucose: 111 mg/dl (ref 70–140)
POTASSIUM: 3.4 meq/L — AB (ref 3.5–5.1)
SODIUM: 142 meq/L (ref 136–145)
Total Bilirubin: 0.84 mg/dL (ref 0.20–1.20)
Total Protein: 6.6 g/dL (ref 6.4–8.3)

## 2017-02-14 LAB — CBC WITH DIFFERENTIAL/PLATELET
BASO%: 0.7 % (ref 0.0–2.0)
BASOS ABS: 0 10*3/uL (ref 0.0–0.1)
EOS%: 2.4 % (ref 0.0–7.0)
Eosinophils Absolute: 0.2 10*3/uL (ref 0.0–0.5)
HEMATOCRIT: 43.6 % (ref 34.8–46.6)
HEMOGLOBIN: 14.7 g/dL (ref 11.6–15.9)
LYMPH%: 43.1 % (ref 14.0–49.7)
MCH: 29.8 pg (ref 25.1–34.0)
MCHC: 33.6 g/dL (ref 31.5–36.0)
MCV: 88.7 fL (ref 79.5–101.0)
MONO#: 0.5 10*3/uL (ref 0.1–0.9)
MONO%: 8.1 % (ref 0.0–14.0)
NEUT#: 2.9 10*3/uL (ref 1.5–6.5)
NEUT%: 45.7 % (ref 38.4–76.8)
Platelets: 259 10*3/uL (ref 145–400)
RBC: 4.92 10*6/uL (ref 3.70–5.45)
RDW: 13.1 % (ref 11.2–14.5)
WBC: 6.4 10*3/uL (ref 3.9–10.3)
lymph#: 2.7 10*3/uL (ref 0.9–3.3)

## 2017-02-14 NOTE — Progress Notes (Signed)
ID: Caroline Mejia   DOB: 30-Sep-1956  MR#: [id number]  IDU#:[id number]  PCP: Aurea Graff.Marlou Sa, MD GYN:  SU: Fanny Skates OTHER MD: Crissie Reese, Tyler Pita, Teena Irani  CHIEF COMPLAINT:  Bilateral Breast Cancers  CURRENT TREATMENT: Anastrozole   HISTORY OF PRESENT ILLNESS: From the earlier treatment summary.  Caroline Mejia was followed by my former partner, [name], for a right-sided breast cancer dating back to March of 2006. I have summarized that data below. More recently, routine screening mammography 11/25/2012 at the breast Center showed a possible mass in the left breast. Diagnostic left mammogram and ultrasonography 12/05/2012 showed a persistent density in the upper inner left breast, which was not palpable by exam. Ultrasound confirmed a vertically oriented hypoechoic mass measuring 0.9 cm. The left axilla was benign.  Biopsy of the left breast mass 12/11/2012 showed ([id number]) an invasive ductal carcinoma, low-grade, estrogen and progesterone receptors both positive at 100%, with an MIB-1 of 8%, and no HER-2 amplification by CISH. On 01/28/2013 the patient underwent left mastectomy with sentinel lymph node sampling. The final pathology ([id number]) showed an invasive ductal carcinoma, grade 2, maximal size 0.9 cm, with both sentinel lymph nodes negative, and ample margins.  The patient's subsequent history is as detailed below.  INTERVAL HISTORY: Caroline Mejia returns today for follow-up of her estrogen receptor positive breast cancer. The interval history is generally benign. She is tolerating anastrozole well. Hot flashes and vaginal dryness are not a major issue. She never developed the arthralgias or myalgias that many patients can experience on this medication. She obtains it at a good price.  She also receives zolendronate yearly, last dose August 2017. She has had no side effects from this treatment.  REVIEW OF SYSTEMS: Caroline Mejia is doing some walking, although  less steadily than before. She has occasional problems with heartburn. A detailed review of systems today was otherwise noncontributory  PAST MEDICAL HISTORY: Past Medical History:  Diagnosis Date  . Breast cancer    bilaterally; "have had chemo & radiation on right side only"  (01/28/2013)  . GERD (gastroesophageal reflux disease)   . H/O hiatal hernia     PAST SURGICAL HISTORY: Past Surgical History:  Procedure Laterality Date  . BREAST BIOPSY Right 2006; 2008  . BREAST BIOPSY Left 12/2012  . BREAST LUMPECTOMY Right 2006  . BREAST REDUCTION SURGERY Left 2009  . BREAST SURGERY    . ESOPHAGOGASTRODUODENOSCOPY N/A 11/19/2014   Procedure: ESOPHAGOGASTRODUODENOSCOPY (EGD);  Surgeon: Cleotis Nipper, MD;  Location: [facility];  Service: Endoscopy;  Laterality: N/A;  . LAPAROSCOPIC NISSEN FUNDOPLICATION N/A 05/28/8137   Procedure: LAPAROSCOPIC NISSEN FUNDOPLICATION;  Surgeon: Alphonsa Overall, MD;  Location: [facility];  Service: General;  Laterality: N/A;  . LATISSIMUS FLAP TO BREAST Left 01/28/2013   Procedure: LEFT LATISSIMUS FLAP TO BREAST WITH SALINE IMPLANT;  Surgeon: Crissie Reese, MD;  Location: [facility];  Service: Plastics;  Laterality: Left;  Marland Kitchen MASTECTOMY Right 2008  . MASTECTOMY Left 01/28/2013  . MASTECTOMY W/ SENTINEL NODE BIOPSY Left 01/28/2013   Procedure: LEFT TOTAL MASTECTOMY WITH SENTINEL LYMPH NODE BIOPSY;  Surgeon: Adin Hector, MD;  Location: [facility];  Service: General;  Laterality: Left;  . RECONSTRUCTION BREAST W/ LATISSIMUS DORSI FLAP Left 01/28/2013  . RECONSTRUCTION BREAST W/ TRAM FLAP Right 2008  . TUBAL LIGATION  1983    FAMILY HISTORY Family History  Problem Relation Age of Onset  . Heart disease Mother   . Heart disease Father   . Cancer Father  the patient's father died from congestive heart failure at the age of 86. The patient's mother is currently 11 years old. The patient has one brother, 2 sisters. There is no history of breast or ovarian cancer in the  family.  GYNECOLOGIC HISTORY:  (reviewed 03/03/2014)  Menarche age 65, first live birth age 17. The patient is GX P2. She went through menopause with her first chemotherapy, in early 2006. She never took hormone replacement.  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 At home she lives with her 61 y/o mother. They share a cat. Daughter Caroline Mejia works as a Scientist, clinical (histocompatibility and immunogenetics) for a [employer]. Son Caroline Mejia works as a Government social research officer in [place]. He is about to have his fourth child. The patient has 6 grandchildren. The oldest will be going to [employer] fall of 2016 She attends [employer]: Not in place but were discussed with the patient May 2014. She is considering name in her daughter as healthcare power of attorney  HEALTH MAINTENANCE:  (updated 03/03/2014)  Social History  Substance Use Topics  . Smoking status: Never Smoker  . Smokeless tobacco: Never Used  . Alcohol use 0.0 oz/week     Comment: 01/28/2013 "glass of wine ~ once/month"     Colonoscopy: 2008/ [facility]  PAP:  August 2014/[facility]   Bone density:  July 2014, osteopenia with a T score of -2.1   Lipid panel: not on file/[facility]  No Known Allergies  Current Outpatient Prescriptions  Medication Sig Dispense Refill  . anastrozole (ARIMIDEX) 1 MG tablet Take 1 tablet (1 mg total) by mouth daily. 90 tablet 3  . calcium carbonate (OS-CAL) 600 MG TABS Take 600 mg by mouth at bedtime.     . cycloSPORINE (RESTASIS) 0.05 % ophthalmic emulsion Place 1 drop into both eyes 2 (two) times daily.    . Omega-3 Fatty Acids (FISH OIL) 1000 MG CAPS Take 1,000 mg by mouth at bedtime.    Marland Kitchen omeprazole (PRILOSEC) 40 MG capsule Take 40 mg by mouth every other day.     . simvastatin (ZOCOR) 40 MG tablet Take 40 mg by mouth at bedtime.       No current facility-administered medications for this visit.     OBJECTIVE: Middle-aged  white woman In no acute distress Vitals:   02/14/17 1440  BP: 133/80  Pulse: 68  Temp: 98.2 F (36.8 C)  Body mass index is 26.3 kg/m.  ECOG:  0 Filed Weights   02/14/17 1440  Weight: 143 lb 12.8 oz (65.2 kg)   Sclerae unicteric, EOMs intact Oropharynx clear and moist No cervical or supraclavicular adenopathy Lungs no rales or rhonchi Heart regular rate and rhythm Abd soft, nontender, positive bowel sounds MSK no focal spinal tenderness, no upper extremity lymphedema Neuro: nonfocal, well oriented, appropriate affect Breasts: She has undergone bilateral mastectomies, with flap reconstruction. I do not palpate any suspicious masses. There are no skin or nipple changes of concern. Both axillae are benign.  LAB RESULTS: Lab Results  Component Value Date   WBC 6.4 02/14/2017   NEUTROABS 2.9 02/14/2017   HGB 14.7 02/14/2017   HCT 43.6 02/14/2017   MCV 88.7 02/14/2017   PLT 259 02/14/2017      Chemistry      Component Value Date/Time   NA 142 02/14/2017 1403   K 3.4 (L) 02/14/2017 1403   CL 108 11/19/2014 0537   CL 106  02/06/2013 1605   CO2 26 02/14/2017 1403   BUN 9.0 02/14/2017 1403   CREATININE 1.0 02/14/2017 1403      Component Value Date/Time   CALCIUM 9.6 02/14/2017 1403   ALKPHOS 55 02/14/2017 1403   AST 25 02/14/2017 1403   ALT 20 02/14/2017 1403   BILITOT 0.84 02/14/2017 1403      STUDIES: Bilateral carotid ultrasonography 12/29/2016 found mild atherosclerotic disease with the estimated stenosis in the internal carotids less than 50% bilaterally. The vertebral arteries were patent  ASSESSMENT: 61 y.o. BRCA negative [place] woman  (1) status post right breast biopsy 12/07/2004 for a clinical T2 N0, stage IIA invasive ductal carcinoma, grade 3, initially read as triple negative  (2) treated neoadjuvantly with cyclophosphamide and doxorubicin in dose dense fashion x2, followed by fluorouracil, epirubicin and cyclophosphamide in dose dense fashion x2,  followed by Taxotere x4, completed 04/20/2005.  (3) status post right mastectomy with sentinel lymph node sampling 05/11/2005 for a residual pT1c pN0 invasive ductal carcinoma, grade 3, again estrogen and progesterone receptor negative, with an MIB-1 of 52%, but HER-2 amplified by FISH with a ratio of 3.75  (4) adjuvant radiation completed 08/02/2005  (5) recurrence to the right breast documented February of 2008, status post right mastectomy 12/27/2006 for a pT1b NX invasive ductal carcinoma, again estrogen and progesterone receptor negative, with an MIB-1 of 64%, and HER-2 amplified by fish with a ratio of 3.58.  (6) adjuvant chemotherapy with weekly carboplatin (x7), Abraxane (x9)  and trastuzumab completed 05/08/2007. Trastuzumab was continued to May 2009, to total one year  (7) left mastectomy with sentinel lymph node sampling 01/28/2013 for a pT1c pN0, stage IA invasive ductal carcinoma, grade 1, estrogen and progesterone receptor both 100% positive, with an MIB-1 of 8%, and no HER-2 amplification  (8) status post remote TRAM flap reconstruction on the right, latissimus flap reconstruction on the left  (9) started anastrozole, August 2014, the goal being to continue for a total of 5 years  (a)  Osteopenia, with bone density 04/29/2013 showing a lowest T score of -2.1  (b) started zoledronic acid 06/03/2013, to be repeated yearly while on anastrozole  (c) repeat bone density 05/03/2015 stable   PLAN: Elizibeth is now 4 years out from definitive surgery for her breast cancer with no evidence of disease recurrence. This is very favorable.  She has been on anastrozole now for nearly 4 years. The plan is to continue that for a total of 5 years at which time she can "graduate" from follow-up.  She is due for repeat bone density this July and for repeat zoledronate in August. However if the bone density is favorable we can omit the zolendronate which in any case would be her last dose  here.  She has a good understanding of this plan. She will see me again in one year. She knows to call for any problems that may develop before that visit. Chauncey Cruel, MD   02/14/2017

## 2017-03-21 ENCOUNTER — Telehealth: Payer: Self-pay

## 2017-03-21 DIAGNOSIS — C50212 Malignant neoplasm of upper-inner quadrant of left female breast: Secondary | ICD-10-CM

## 2017-03-21 MED ORDER — ANASTROZOLE 1 MG PO TABS: 1.0000 mg | ORAL_TABLET | Freq: Every day | ORAL | 3 refills | Status: DC

## 2017-03-21 NOTE — Telephone Encounter (Signed)
[facility] called for anastrozole refill. Done per protocol.

## 2017-05-03 ENCOUNTER — Ambulatory Visit
Admission: RE | Admit: 2017-05-03 | Discharge: 2017-05-03 | Disposition: A | Discharge status: Home / Self Care | Payer: 59 | Source: Ambulatory Visit | Attending: Oncology | Admitting: Oncology

## 2017-05-03 DIAGNOSIS — Z170 Estrogen receptor positive status [ER+]: Principal | ICD-10-CM

## 2017-05-03 DIAGNOSIS — C50212 Malignant neoplasm of upper-inner quadrant of left female breast: Secondary | ICD-10-CM

## 2017-05-03 DIAGNOSIS — C50811 Malignant neoplasm of overlapping sites of right female breast: Secondary | ICD-10-CM

## 2017-05-03 DIAGNOSIS — Z171 Estrogen receptor negative status [ER-]: Secondary | ICD-10-CM

## 2017-06-07 ENCOUNTER — Other Ambulatory Visit: Payer: 59

## 2017-06-07 ENCOUNTER — Ambulatory Visit (HOSPITAL_BASED_OUTPATIENT_CLINIC_OR_DEPARTMENT_OTHER): Payer: Self-pay

## 2017-06-07 DIAGNOSIS — M85812 Other specified disorders of bone density and structure, left shoulder: Secondary | ICD-10-CM

## 2017-06-07 DIAGNOSIS — C50212 Malignant neoplasm of upper-inner quadrant of left female breast: Secondary | ICD-10-CM

## 2017-06-07 DIAGNOSIS — Z171 Estrogen receptor negative status [ER-]: Secondary | ICD-10-CM

## 2017-06-07 DIAGNOSIS — Z170 Estrogen receptor positive status [ER+]: Secondary | ICD-10-CM

## 2017-06-07 DIAGNOSIS — M85811 Other specified disorders of bone density and structure, right shoulder: Secondary | ICD-10-CM

## 2017-06-07 DIAGNOSIS — C50811 Malignant neoplasm of overlapping sites of right female breast: Secondary | ICD-10-CM

## 2017-06-07 MED ORDER — SODIUM CHLORIDE 0.9 % IV SOLN
Freq: Once | INTRAVENOUS | Status: AC
  Administered 2017-06-07: 16:00:00 via INTRAVENOUS

## 2017-06-07 MED ORDER — ZOLEDRONIC ACID 4 MG/100ML IV SOLN
4.0000 mg | Freq: Once | INTRAVENOUS | Status: DC
  Filled 2017-06-07: qty 100

## 2017-06-07 NOTE — Patient Instructions (Addendum)

## 2017-08-14 ENCOUNTER — Other Ambulatory Visit: Payer: Self-pay | Admitting: Nurse Practitioner

## 2018-03-13 ENCOUNTER — Other Ambulatory Visit: Payer: Self-pay | Admitting: *Deleted

## 2018-03-13 ENCOUNTER — Other Ambulatory Visit: Payer: Self-pay | Admitting: Oncology

## 2018-03-13 DIAGNOSIS — C50212 Malignant neoplasm of upper-inner quadrant of left female breast: Secondary | ICD-10-CM

## 2018-03-13 DIAGNOSIS — Z170 Estrogen receptor positive status [ER+]: Principal | ICD-10-CM

## 2018-03-13 NOTE — Progress Notes (Signed)
ID: Caroline Mejia   DOB: 20-Apr-1956  MR#: [id number]  KVQ#:[id number]  PCP: Aurea Graff.Marlou Sa, MD GYN:  SU: Fanny Skates OTHER MD: Crissie Reese, Tyler Pita, Teena Irani  CHIEF COMPLAINT:  Bilateral Breast Cancers  CURRENT TREATMENT: Completing 5 years of anastrozole   HISTORY OF PRESENT ILLNESS: From the earlier treatment summary.  Caroline Mejia was followed by my former partner, [name], for a right-sided breast cancer dating back to March of 2006. I have summarized that data below. More recently, routine screening mammography 11/25/2012 at the breast Center showed a possible mass in the left breast. Diagnostic left mammogram and ultrasonography 12/05/2012 showed a persistent density in the upper inner left breast, which was not palpable by exam. Ultrasound confirmed a vertically oriented hypoechoic mass measuring 0.9 cm. The left axilla was benign.  Biopsy of the left breast mass 12/11/2012 showed ([id number]) an invasive ductal carcinoma, low-grade, estrogen and progesterone receptors both positive at 100%, with an MIB-1 of 8%, and no HER-2 amplification by CISH. On 01/28/2013 the patient underwent left mastectomy with sentinel lymph node sampling. The final pathology ([id number]) showed an invasive ductal carcinoma, grade 2, maximal size 0.9 cm, with both sentinel lymph nodes negative, and ample margins.  The patient's subsequent history is as detailed below.  INTERVAL HISTORY: Caroline Mejia returns today for follow-up of her estrogen receptor positive breast cancer. She continues on anastrozole, with good tolerance. She has hot flashes that are not intense. She denies issues with vaginal dryness. She has 1 week left of the pills.   Sine her last visit, she completed a bone density on 05/03/2017 which showed a T score of -1.8 osteopenia.   REVIEW OF SYSTEMS: Caroline Mejia reports that she has been working. She recently went to [place] for a week with her mother and sister. She  went to the flower festival there.  She continues to follow up with her PCP, Dr. Alroy Dust.  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 She denies unusual headaches, visual changes, nausea, vomiting, or dizziness. There has been no unusual cough, phlegm production, or pleurisy. This been no change in bowel or bladder habits. She denies unexplained fatigue or unexplained weight loss, bleeding, rash, or fever. A detailed review of systems was otherwise stable.   PAST MEDICAL HISTORY: Past Medical History:  Diagnosis Date  . Breast cancer    bilaterally; "have had chemo & radiation on right side only"  (01/28/2013)  . GERD (gastroesophageal reflux disease)   . H/O hiatal hernia     PAST SURGICAL HISTORY: Past Surgical History:  Procedure Laterality Date  . BREAST BIOPSY Right 2006; 2008  . BREAST BIOPSY Left 12/2012  . BREAST LUMPECTOMY Right 2006  . BREAST REDUCTION SURGERY Left 2009  . BREAST SURGERY    . ESOPHAGOGASTRODUODENOSCOPY N/A 11/19/2014   Procedure: ESOPHAGOGASTRODUODENOSCOPY (EGD);  Surgeon: Cleotis Nipper, MD;  Location: [facility];  Service: Endoscopy;  Laterality: N/A;  . LAPAROSCOPIC NISSEN FUNDOPLICATION N/A 02/24/8415   Procedure: LAPAROSCOPIC NISSEN FUNDOPLICATION;  Surgeon: Alphonsa Overall, MD;  Location: [facility];  Service: General;  Laterality: N/A;  . LATISSIMUS FLAP TO BREAST Left 01/28/2013   Procedure: LEFT LATISSIMUS FLAP TO BREAST WITH SALINE IMPLANT;  Surgeon: Crissie Reese, MD;  Location: [facility];  Service: Plastics;  Laterality: Left;  Marland Kitchen MASTECTOMY Right 2008  . MASTECTOMY Left 01/28/2013  . MASTECTOMY W/ SENTINEL NODE BIOPSY Left 01/28/2013  Procedure: LEFT TOTAL MASTECTOMY WITH SENTINEL LYMPH NODE BIOPSY;  Surgeon: Adin Hector, MD;  Location: [facility];  Service: General;  Laterality: Left;  . RECONSTRUCTION BREAST W/ LATISSIMUS DORSI FLAP Left 01/28/2013  . RECONSTRUCTION BREAST W/ TRAM FLAP Right  2008  . TUBAL LIGATION  1983    FAMILY HISTORY Family History  Problem Relation Age of Onset  . Heart disease Mother   . Heart disease Father   . Cancer Father    the patient's father died from congestive heart failure at the age of 69. The patient's mother is currently 78 years old. The patient has one brother, 2 sisters. There is no history of breast or ovarian cancer in the family.  GYNECOLOGIC HISTORY:  (reviewed 03/03/2014)  Menarche age 62, first live birth age 58. The patient is GX P2. She went through menopause with her first chemotherapy, in early 2006. She never took hormone replacement.  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 At home she lives with her 49 y/o mother.  Daughter Caroline Mejia works as a Scientist, clinical (histocompatibility and immunogenetics) for a [employer]. Son Caroline Mejia works as a Government social research officer in [place]. He is about to have his fourth child. The patient has 6 grandchildren. The oldest will be going to [employer] fall of 2016 She attends [employer]: Not in place but were discussed with the patient May 2014. She is considering name in her daughter as healthcare power of attorney  HEALTH MAINTENANCE:  (updated 03/03/2014)  Social History   Tobacco Use  . Smoking status: Never Smoker  . Smokeless tobacco: Never Used  Substance Use Topics  . Alcohol use: Yes    Alcohol/week: 0.0 oz    Comment: 01/28/2013 "glass of wine ~ once/month"  . Drug use: No     Colonoscopy: 2008/ [facility]  PAP: July 2017/ Dr. [facility]/ normal  Bone density:  05/03/2017 showed a T score of -1.8 osteopenia  Lipid panel: not on file/[facility]  No Known Allergies  Current Outpatient Medications  Medication Sig Dispense Refill  . anastrozole (ARIMIDEX) 1 MG tablet TAKE 1 TABLET BY MOUTH EVERY DAY 90 tablet 3  . calcium carbonate (OS-CAL) 600 MG TABS Take 600 mg by mouth at bedtime.     . cycloSPORINE (RESTASIS) 0.05 %  ophthalmic emulsion Place 1 drop into both eyes 2 (two) times daily.    . Omega-3 Fatty Acids (FISH OIL) 1000 MG CAPS Take 1,000 mg by mouth at bedtime.    Marland Kitchen omeprazole (PRILOSEC) 40 MG capsule Take 40 mg by mouth every other day.     . simvastatin (ZOCOR) 40 MG tablet Take 40 mg by mouth at bedtime.       No current facility-administered medications for this visit.     OBJECTIVE: Middle-aged white woman who appears well  Vitals:   03/14/18 0828  BP: 121/75  Pulse: 67  Resp: 18  Temp: 98.6 F (37 C)  SpO2: 100%  Body mass index is 25.51 kg/m.  ECOG:  0 Filed Weights   03/14/18 0828  Weight: 139 lb 8 oz (63.3 kg)   Sclerae unicteric, pupils round and equal No cervical or supraclavicular adenopathy Lungs no rales or rhonchi Heart regular rate and rhythm Abd soft, nontender, positive bowel sounds MSK no focal spinal tenderness, no upper extremity lymphedema Neuro: nonfocal, well oriented, appropriate affect Breasts: Status post bilateral mastectomies, with flap reconstruction.  There is no  evidence of local recurrence.  Both axillae are benign.  LAB RESULTS: Lab Results  Component Value Date   WBC 5.7 03/14/2018   NEUTROABS 2.8 03/14/2018   HGB 15.3 03/14/2018   HCT 46.0 03/14/2018   MCV 91.5 03/14/2018   PLT 257 03/14/2018      Chemistry      Component Value Date/Time   NA 142 02/14/2017 1403   K 3.4 (L) 02/14/2017 1403   CL 108 11/19/2014 0537   CL 106 02/06/2013 1605   CO2 26 02/14/2017 1403   BUN 9.0 02/14/2017 1403   CREATININE 1.0 02/14/2017 1403      Component Value Date/Time   CALCIUM 9.6 02/14/2017 1403   ALKPHOS 55 02/14/2017 1403   AST 25 02/14/2017 1403   ALT 20 02/14/2017 1403   BILITOT 0.84 02/14/2017 1403      STUDIES: Bone density 05/03/2017 showed a T score of -1.8 osteopenia  ASSESSMENT: 62 y.o. BRCA negative Caroline Mejia woman  (1) status post right breast biopsy 12/07/2004 for a clinical T2 N0, stage IIA invasive ductal carcinoma,  grade 3, initially read as triple negative  (2) treated neoadjuvantly with cyclophosphamide and doxorubicin in dose dense fashion x2, followed by fluorouracil, epirubicin and cyclophosphamide in dose dense fashion x2, followed by Taxotere x4, completed 04/20/2005.  (3) status post right lumpectomy with sentinel lymph node sampling 05/11/2005 for a residual pT1c pN0 invasive ductal carcinoma, grade 3, again estrogen and progesterone receptor negative, with an MIB-1 of 52%, but HER-2 amplified by FISH with a ratio of 3.75  (4) adjuvant radiation completed 08/02/2005  (5) recurrence to the right breast documented February of 2008, status post right mastectomy 12/27/2006 for a pT1b NX invasive ductal carcinoma, again estrogen and progesterone receptor negative, with an MIB-1 of 64%, and HER-2 amplified by fish with a ratio of 3.58.  (6) adjuvant chemotherapy with weekly carboplatin (x7), Abraxane (x9)  and trastuzumab completed 05/08/2007. Trastuzumab was continued to May 2009, to total one year  (7) left mastectomy with sentinel lymph node sampling 01/28/2013 for a pT1c pN0, stage IA invasive ductal carcinoma, grade 1, estrogen and progesterone receptor both 100% positive, with an MIB-1 of 8%, and no HER-2 amplification  (8) status post remote TRAM flap reconstruction on the right, latissimus flap reconstruction on the left  (9) started anastrozole, August 2014, stopping June 2019  (a)  Osteopenia, with bone density 04/29/2013 showing a lowest T score of -2.1  (b) started zoledronic acid 06/03/2013, repeated yearly through August 2018  (c) repeat bone density 05/03/2015 stable  (d) done density 05/03/2017 showed a T score of -1.8 osteopenia this is improved)   PLAN: Jakaila is now just over 5 years out from definitive surgery for her breast cancer with no evidence of disease recurrence.  This is very favorable.  She is completing 5 years of antiestrogens.  I do not think there would be  significant benefit for her continuing and so I am comfortable with her stopping at this point  She will be followed from this point through her primary care physician's office.  All she will need in terms of breast cancer follow-up is a yearly physician breast exam.  I will be glad to see E[place] again at any point in the future if and when the need arises, but as of now are making no further routine appointments for her here.  Magrinat, Virgie Dad, MD  03/14/18 8:57 AM Medical Oncology and Hematology [facility] [address]  [place], [place] [address] Tel. [phone]    Fax. [phone]  Alice Rieger, am acting as scribe for Chauncey Cruel MD.  I, Lurline Del MD, have reviewed the above documentation for accuracy and completeness, and I agree with the above.

## 2018-03-14 ENCOUNTER — Inpatient Hospital Stay: Payer: 59

## 2018-03-14 ENCOUNTER — Inpatient Hospital Stay: Payer: 59 | Attending: Oncology | Admitting: Oncology

## 2018-03-14 ENCOUNTER — Encounter: Payer: Self-pay | Admitting: Oncology

## 2018-03-14 VITALS — BP 121/75 | HR 67 | Temp 98.6°F | Resp 18 | Ht 62.0 in | Wt 139.5 lb

## 2018-03-14 DIAGNOSIS — Z79811 Long term (current) use of aromatase inhibitors: Secondary | ICD-10-CM

## 2018-03-14 DIAGNOSIS — N951 Menopausal and female climacteric states: Secondary | ICD-10-CM | POA: Diagnosis not present

## 2018-03-14 DIAGNOSIS — Z170 Estrogen receptor positive status [ER+]: Secondary | ICD-10-CM | POA: Diagnosis not present

## 2018-03-14 DIAGNOSIS — Z923 Personal history of irradiation: Secondary | ICD-10-CM | POA: Insufficient documentation

## 2018-03-14 DIAGNOSIS — Z853 Personal history of malignant neoplasm of breast: Secondary | ICD-10-CM | POA: Diagnosis not present

## 2018-03-14 DIAGNOSIS — C50212 Malignant neoplasm of upper-inner quadrant of left female breast: Secondary | ICD-10-CM | POA: Insufficient documentation

## 2018-03-14 DIAGNOSIS — Z79899 Other long term (current) drug therapy: Secondary | ICD-10-CM | POA: Diagnosis not present

## 2018-03-14 DIAGNOSIS — Z9221 Personal history of antineoplastic chemotherapy: Secondary | ICD-10-CM

## 2018-03-14 DIAGNOSIS — M8580 Other specified disorders of bone density and structure, unspecified site: Secondary | ICD-10-CM | POA: Diagnosis not present

## 2018-03-14 DIAGNOSIS — Z171 Estrogen receptor negative status [ER-]: Secondary | ICD-10-CM | POA: Insufficient documentation

## 2018-03-14 DIAGNOSIS — C50811 Malignant neoplasm of overlapping sites of right female breast: Secondary | ICD-10-CM

## 2018-03-14 LAB — CMP (CANCER CENTER ONLY)
ALT: 30 U/L (ref 0–55)
AST: 33 U/L (ref 5–34)
Albumin: 4.1 g/dL (ref 3.5–5.0)
Alkaline Phosphatase: 62 U/L (ref 40–150)
Anion gap: 9 (ref 3–11)
BUN: 11 mg/dL (ref 7–26)
CHLORIDE: 105 mmol/L (ref 98–109)
CO2: 28 mmol/L (ref 22–29)
CREATININE: 0.91 mg/dL (ref 0.60–1.10)
Calcium: 9.9 mg/dL (ref 8.4–10.4)
GFR, Estimated: >60 mL/min (ref >60)
Glucose, Bld: 91 mg/dL (ref 70–140)
POTASSIUM: 4.2 mmol/L (ref 3.5–5.1)
SODIUM: 142 mmol/L (ref 136–145)
Total Bilirubin: 1.2 mg/dL (ref 0.2–1.2)
Total Protein: 7.2 g/dL (ref 6.4–8.3)

## 2018-03-14 LAB — CBC WITH DIFFERENTIAL (CANCER CENTER ONLY)
Basophils Absolute: 0 10*3/uL (ref 0.0–0.1)
Basophils Relative: 1 %
EOS ABS: 0.1 10*3/uL (ref 0.0–0.5)
Eosinophils Relative: 2 %
HEMATOCRIT: 46 % (ref 34.8–46.6)
HEMOGLOBIN: 15.3 g/dL (ref 11.6–15.9)
LYMPHS ABS: 2.3 10*3/uL (ref 0.9–3.3)
LYMPHS PCT: 40 %
MCH: 30.4 pg (ref 25.1–34.0)
MCHC: 33.3 g/dL (ref 31.5–36.0)
MCV: 91.5 fL (ref 79.5–101.0)
MONOS PCT: 9 %
Monocytes Absolute: 0.5 10*3/uL (ref 0.1–0.9)
NEUTROS ABS: 2.8 10*3/uL (ref 1.5–6.5)
NEUTROS PCT: 48 %
Platelet Count: 257 10*3/uL (ref 145–400)
RBC: 5.03 MIL/uL (ref 3.70–5.45)
RDW: 12.8 % (ref 11.2–14.5)
WBC: 5.7 10*3/uL (ref 3.9–10.3)

## 2018-03-15 ENCOUNTER — Telehealth: Payer: Self-pay | Admitting: Oncology

## 2018-03-15 NOTE — Telephone Encounter (Signed)
Per 6/6 no los 

## 2019-07-29 ENCOUNTER — Other Ambulatory Visit: Payer: Self-pay | Admitting: Family Medicine

## 2019-07-29 ENCOUNTER — Other Ambulatory Visit (HOSPITAL_COMMUNITY)
Admission: RE | Admit: 2019-07-29 | Discharge: 2019-07-29 | Disposition: A | Discharge status: Home / Self Care | Payer: Managed Care, Other (non HMO) | Source: Ambulatory Visit | Attending: Family Medicine | Admitting: Family Medicine

## 2019-07-29 DIAGNOSIS — Z01419 Encounter for gynecological examination (general) (routine) without abnormal findings: Secondary | ICD-10-CM | POA: Diagnosis not present

## 2019-07-30 LAB — CYTOLOGY - PAP: Diagnosis: NEGATIVE

## 2019-11-18 ENCOUNTER — Ambulatory Visit: Payer: Managed Care, Other (non HMO) | Attending: Internal Medicine

## 2019-11-18 DIAGNOSIS — Z23 Encounter for immunization: Secondary | ICD-10-CM | POA: Insufficient documentation

## 2019-11-18 NOTE — Progress Notes (Signed)
   Covid-19 Vaccination Clinic  Name:  DANNALY ARAI    MRN: [id number] DOB: January 04, 1956  11/18/2019  Ms. Boccio was observed post Covid-19 immunization for 15 minutes without incidence. She was provided with Vaccine Information Sheet and instruction to access the V-Safe system.   Ms. Vanhorn was instructed to call 911 with any severe reactions post vaccine: Marland Kitchen Difficulty breathing  . Swelling of your face and throat  . A fast heartbeat  . A bad rash all over your body  . Dizziness and weakness    Immunizations Administered    Name Date Dose VIS Date Route   Pfizer COVID-19 Vaccine 11/18/2019  3:09 PM 0.3 mL 09/19/2019 Intramuscular   Manufacturer: Rafael Hernandez   Lot: [id number]   [place]: [id number]

## 2019-12-13 ENCOUNTER — Ambulatory Visit: Payer: Managed Care, Other (non HMO) | Attending: Internal Medicine

## 2019-12-13 DIAGNOSIS — Z23 Encounter for immunization: Secondary | ICD-10-CM | POA: Insufficient documentation

## 2019-12-13 NOTE — Progress Notes (Signed)
   Covid-19 Vaccination Clinic  Name:  Caroline Mejia    MRN: [id number] DOB: [birth year]  12/13/2019  [name] was observed post Covid-19 immunization for 15 minutes without incident. She was provided with Vaccine Information Sheet and instruction to access the V-Safe system.   [name] was instructed to call 911 with any severe reactions post vaccine: Marland Kitchen Difficulty breathing  . Swelling of face and throat  . A fast heartbeat  . A bad rash all over body  . Dizziness and weakness   Immunizations Administered    Name Date Dose VIS Date Route   Pfizer COVID-19 Vaccine 12/13/2019  9:29 AM 0.3 mL 09/19/2019 Intramuscular   Manufacturer: [place]   Lot: [id number]   [place]: [id number]

## 2020-08-02 ENCOUNTER — Other Ambulatory Visit: Payer: Self-pay | Admitting: Family Medicine

## 2020-08-02 DIAGNOSIS — M8580 Other specified disorders of bone density and structure, unspecified site: Secondary | ICD-10-CM

## 2020-08-04 ENCOUNTER — Other Ambulatory Visit: Payer: Self-pay

## 2020-08-04 ENCOUNTER — Ambulatory Visit
Admission: RE | Admit: 2020-08-04 | Discharge: 2020-08-04 | Disposition: A | Discharge status: Home / Self Care | Payer: Medicare Other | Source: Ambulatory Visit | Attending: Family Medicine | Admitting: Family Medicine

## 2020-08-04 DIAGNOSIS — M8580 Other specified disorders of bone density and structure, unspecified site: Secondary | ICD-10-CM

## 2021-06-10 ENCOUNTER — Emergency Department (HOSPITAL_COMMUNITY): Payer: No Typology Code available for payment source

## 2021-06-10 ENCOUNTER — Emergency Department (HOSPITAL_COMMUNITY)
Admission: EM | Admit: 2021-06-10 | Discharge: 2021-06-11 | Disposition: A | Discharge status: Home / Self Care | Payer: No Typology Code available for payment source | Attending: Emergency Medicine | Admitting: Emergency Medicine

## 2021-06-10 DIAGNOSIS — S161XXA Strain of muscle, fascia and tendon at neck level, initial encounter: Secondary | ICD-10-CM | POA: Diagnosis not present

## 2021-06-10 DIAGNOSIS — S199XXA Unspecified injury of neck, initial encounter: Secondary | ICD-10-CM | POA: Diagnosis present

## 2021-06-10 DIAGNOSIS — Z853 Personal history of malignant neoplasm of breast: Secondary | ICD-10-CM | POA: Diagnosis not present

## 2021-06-10 DIAGNOSIS — Y92410 Unspecified street and highway as the place of occurrence of the external cause: Secondary | ICD-10-CM | POA: Diagnosis not present
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
## 2021-06-11 DIAGNOSIS — S161XXA Strain of muscle, fascia and tendon at neck level, initial encounter: Secondary | ICD-10-CM | POA: Diagnosis not present

## 2021-06-11 MED ORDER — IBUPROFEN 800 MG PO TABS
800.0000 mg | ORAL_TABLET | Freq: Once | ORAL | Status: AC
  Administered 2021-06-11: 800 mg via ORAL
  Filled 2021-06-11: qty 1

## 2021-06-28 ENCOUNTER — Encounter: Payer: Self-pay | Admitting: Oncology

## 2021-07-16 ENCOUNTER — Other Ambulatory Visit: Payer: Self-pay | Admitting: Nurse Practitioner

## 2023-09-18 ENCOUNTER — Other Ambulatory Visit: Payer: Self-pay | Admitting: Family Medicine

## 2023-09-18 DIAGNOSIS — M8588 Other specified disorders of bone density and structure, other site: Secondary | ICD-10-CM

## 2024-05-08 ENCOUNTER — Other Ambulatory Visit: Payer: Managed Care, Other (non HMO)

## 2024-05-25 ENCOUNTER — Emergency Department (HOSPITAL_BASED_OUTPATIENT_CLINIC_OR_DEPARTMENT_OTHER)
Admission: EM | Admit: 2024-05-25 | Discharge: 2024-05-25 | Disposition: A | Discharge status: Home / Self Care | Payer: Medicare (Managed Care) | Attending: Emergency Medicine | Admitting: Emergency Medicine

## 2024-05-25 ENCOUNTER — Other Ambulatory Visit: Payer: Self-pay

## 2024-05-25 DIAGNOSIS — L509 Urticaria, unspecified: Secondary | ICD-10-CM | POA: Diagnosis not present

## 2024-05-25 DIAGNOSIS — R21 Rash and other nonspecific skin eruption: Secondary | ICD-10-CM | POA: Diagnosis present

## 2024-05-25 MED ORDER — PREDNISONE 20 MG PO TABS: 40.0000 mg | ORAL_TABLET | Freq: Every day | ORAL | 0 refills | Status: AC

## 2024-05-25 NOTE — ED Provider Notes (Signed)
 [place] EMERGENCY DEPARTMENT AT [facility] Provider Note   CSN: [id number] Arrival date & time: 05/25/24  1109     Patient presents with: Rash   Caroline Mejia is a 68 y.o. female.   Patient presents to the emergency department for evaluation of facial rash.  This started while patient was at church today.  She was notified by someone there that her face was breaking out.  Patient has not used any new products on her face or skin.  She denies any other environmental exposures.  No new foods or medications.  She did have a piece of cake while there.  She has not had reactions like this before.  No associated lip or tongue swelling, difficulty breathing or swallowing.  No vomiting or diarrhea.  No lightheadedness or syncope.  She was given 1 tablet of Benadryl  by a friend who is with her at bedside.       Prior to Admission medications   Medication Sig Start Date End Date Taking? Authorizing Provider  predniSONE  (DELTASONE ) 20 MG tablet Take 2 tablets (40 mg total) by mouth daily. 05/25/24  Yes Desiderio Chew, PA-C  anastrozole  (ARIMIDEX ) 1 MG tablet TAKE 1 TABLET BY MOUTH EVERY DAY 03/13/18   Magrinat, Sandria BROCKS, MD  calcium carbonate (OS-CAL) 600 MG TABS Take 600 mg by mouth at bedtime.     [provider]  cycloSPORINE  (RESTASIS ) 0.05 % ophthalmic emulsion Place 1 drop into both eyes 2 (two) times daily.    [provider]  Omega-3 Fatty Acids (FISH OIL) 1000 MG CAPS Take 1,000 mg by mouth at bedtime.    [provider]  simvastatin  (ZOCOR ) 40 MG tablet Take 40 mg by mouth at bedtime.      [provider]    Allergies: Patient has no known allergies.    Review of Systems  Updated Vital Signs BP (!) 160/105 (BP Location: Left Arm)   Pulse 83   Temp 97.7 F (36.5 C)   Resp 20   Ht 5' 2 (1.575 m)   Wt 61.2 kg   SpO2 97%   BMI 24.69 kg/m   Physical Exam Vitals and nursing note reviewed.  Constitutional:       Appearance: She is well-developed.  HENT:     Head: Normocephalic and atraumatic.     Comments: Patient with a few residual hives noted on the right cheek, chin area.    Right Ear: External ear normal.     Left Ear: External ear normal.     Mouth/Throat:     Comments: No angioedema or intraoral lesions. Eyes:     Conjunctiva/sclera: Conjunctivae normal.  Pulmonary:     Effort: No respiratory distress.  Musculoskeletal:     Cervical back: Normal range of motion and neck supple.  Skin:    General: Skin is warm and dry.     Comments: No hives on upper chest or back.  Neurological:     Mental Status: She is alert.     (all labs ordered are listed, but only abnormal results are displayed) Labs Reviewed - No data to display  EKG: None  Radiology: No results found.   Procedures   Medications Ordered in the ED - No data to display  ED Course  Patient seen and examined. History obtained directly from patient.  She has hives that are resolving.  Labs/EKG: None ordered  Imaging: None ordered  Medications/Fluids: None ordered  Most recent vital signs reviewed and are  as follows: BP (!) 160/105 (BP Location: Left Arm)   Pulse 83   Temp 97.7 F (36.5 C)   Resp 20   Ht 5' 2 (1.575 m)   Wt 61.2 kg   SpO2 97%   BMI 24.69 kg/m   Initial impression: Urticaria of the face, unclear etiology, resolving after Benadryl   Home treatment plan: Patient encouraged to take Benadryl  in the evening and loratadine  in the morning for the next 3 to 5 days, also Pepcid.  We discussed use of prednisone .  I will send in a prescription, but patient does not need to fill this unless her symptoms recur.    Return instructions discussed with patient: We discussed signs of anaphylaxis and need to call 911 if these occur.  Follow-up instructions discussed with patient: PCP as needed                                   Medical Decision Making Risk Prescription drug management.   Patient  with facial urticaria today, unclear cause.  No signs of anaphylaxis.  Symptoms improving after antihistamines.  Patient looks well.  Treatment plan as above.  No indication for further lab work or imaging at this time.      Final diagnoses:  Urticaria    ED Discharge Orders          Ordered    predniSONE  (DELTASONE ) 20 MG tablet  Daily        05/25/24 1344               Desiderio Chew, PA-C 05/25/24 1348    Tonia Chew, MD 05/25/24 1642

## 2024-05-25 NOTE — ED Triage Notes (Signed)
 Pt POV reporting rash on R side of face that began a few hours ago, noticed by someone else at church, denies itching or discomfort. Pt also reports tingling in R hand that began about 30 min ago.

## 2024-05-25 NOTE — Discharge Instructions (Signed)
 Please read and follow all provided instructions.  Your diagnoses today include:  1. Urticaria     Tests performed today include: Vital signs. See below for your results today.   Medications prescribed:  Prednisone  - steroid medicine   Fill this medicine and take for 5 days if you have recurrent hives, or if area becomes worse.  It is best to take this medication in the morning to prevent sleeping problems. If you are diabetic, monitor your blood sugar closely and stop taking Prednisone  if blood sugar is over 300. Take with food to prevent stomach upset.   Take any prescribed medications only as directed.  Home care instructions:  Follow any educational materials contained in this packet You may consider taking antihistamine such as Benadryl  25 mg at bedtime and loratadine  10 mg during the day --for the next 3 to 5 days --to help control hives. You may also consider taking Pepcid (famotidine)10 mg twice a day as this also blocks histamines.  Follow-up instructions: Please follow-up with your primary care provider in the next 3 days for further evaluation of your symptoms.   Return instructions:  Please return to the Emergency Department if you experience worsening symptoms.  Call 9-1-1 immediately if you have an allergic reaction that involves your lips, mouth, throat or if you have any difficulty breathing. This is a life-threatening emergency.  Please return if you have any other emergent concerns.  Additional Information:  Your vital signs today were: BP (!) 160/105 (BP Location: Left Arm)   Pulse 83   Temp 97.7 F (36.5 C)   Resp 20   Ht 5' 2 (1.575 m)   Wt 61.2 kg   SpO2 97%   BMI 24.69 kg/m  If your blood pressure (BP) was elevated above 135/85 this visit, please have this repeated by your doctor within one month. --------------

## 2024-05-25 NOTE — ED Notes (Signed)
 Dc instructions given, pt verbalized understanding. Out of ED with all belongings and paperwork with steady gait, not in visible distress.

## 2024-07-23 ENCOUNTER — Ambulatory Visit (HOSPITAL_BASED_OUTPATIENT_CLINIC_OR_DEPARTMENT_OTHER)
Admission: RE | Admit: 2024-07-23 | Discharge: 2024-07-23 | Disposition: A | Discharge status: Home / Self Care | Payer: Medicare (Managed Care) | Source: Ambulatory Visit | Attending: Family Medicine | Admitting: Family Medicine

## 2024-07-23 DIAGNOSIS — M8588 Other specified disorders of bone density and structure, other site: Secondary | ICD-10-CM | POA: Insufficient documentation
